# Patient Record
Sex: Female | Born: 1970 | Race: White | Hispanic: Yes | Smoking: Current every day smoker
Health system: Southern US, Community
[De-identification: ages and names within clinical notes are randomized; demographics above are authoritative.]

## PROBLEM LIST (undated history)

## (undated) DIAGNOSIS — F329 Major depressive disorder, single episode, unspecified: Secondary | ICD-10-CM

## (undated) DIAGNOSIS — M1990 Unspecified osteoarthritis, unspecified site: Secondary | ICD-10-CM

## (undated) DIAGNOSIS — F32A Depression, unspecified: Secondary | ICD-10-CM

## (undated) DIAGNOSIS — J302 Other seasonal allergic rhinitis: Secondary | ICD-10-CM

## (undated) DIAGNOSIS — K279 Peptic ulcer, site unspecified, unspecified as acute or chronic, without hemorrhage or perforation: Secondary | ICD-10-CM

## (undated) HISTORY — PX: CHOLECYSTECTOMY: SHX55

## (undated) HISTORY — DX: Unspecified osteoarthritis, unspecified site: M19.90

## (undated) HISTORY — DX: Depression, unspecified: F32.A

## (undated) HISTORY — DX: Major depressive disorder, single episode, unspecified: F32.9

## (undated) HISTORY — DX: Other seasonal allergic rhinitis: J30.2

## (undated) HISTORY — DX: Peptic ulcer, site unspecified, unspecified as acute or chronic, without hemorrhage or perforation: K27.9

---

## 1989-10-18 HISTORY — PX: TUBAL LIGATION: SHX77

## 1999-01-30 ENCOUNTER — Encounter: Payer: Self-pay | Admitting: Endocrinology

## 1999-01-30 ENCOUNTER — Emergency Department (HOSPITAL_COMMUNITY): Admission: EM | Admit: 1999-01-30 | Discharge: 1999-01-30 | Payer: Self-pay

## 2003-01-09 ENCOUNTER — Emergency Department (HOSPITAL_COMMUNITY): Admission: EM | Admit: 2003-01-09 | Discharge: 2003-01-09 | Payer: Self-pay | Admitting: Emergency Medicine

## 2003-08-23 ENCOUNTER — Ambulatory Visit (HOSPITAL_COMMUNITY): Admission: RE | Admit: 2003-08-23 | Discharge: 2003-08-23 | Payer: Self-pay | Admitting: Family Medicine

## 2005-03-26 ENCOUNTER — Ambulatory Visit: Payer: Self-pay | Admitting: *Deleted

## 2005-03-26 ENCOUNTER — Ambulatory Visit: Payer: Self-pay | Admitting: Family Medicine

## 2005-04-06 ENCOUNTER — Ambulatory Visit (HOSPITAL_COMMUNITY): Admission: RE | Admit: 2005-04-06 | Discharge: 2005-04-06 | Payer: Self-pay | Admitting: Family Medicine

## 2005-06-07 ENCOUNTER — Ambulatory Visit: Payer: Self-pay | Admitting: Family Medicine

## 2005-06-08 ENCOUNTER — Ambulatory Visit (HOSPITAL_COMMUNITY): Admission: RE | Admit: 2005-06-08 | Discharge: 2005-06-08 | Payer: Self-pay | Admitting: Family Medicine

## 2006-06-14 ENCOUNTER — Emergency Department (HOSPITAL_COMMUNITY): Admission: EM | Admit: 2006-06-14 | Discharge: 2006-06-14 | Payer: Self-pay | Admitting: Emergency Medicine

## 2006-06-16 ENCOUNTER — Emergency Department (HOSPITAL_COMMUNITY): Admission: EM | Admit: 2006-06-16 | Discharge: 2006-06-16 | Payer: Self-pay | Admitting: Emergency Medicine

## 2006-06-21 ENCOUNTER — Emergency Department (HOSPITAL_COMMUNITY): Admission: EM | Admit: 2006-06-21 | Discharge: 2006-06-21 | Payer: Self-pay | Admitting: Emergency Medicine

## 2006-07-05 ENCOUNTER — Ambulatory Visit: Payer: Self-pay | Admitting: Family Medicine

## 2006-07-05 ENCOUNTER — Encounter (INDEPENDENT_AMBULATORY_CARE_PROVIDER_SITE_OTHER): Payer: Self-pay | Admitting: Specialist

## 2006-07-19 ENCOUNTER — Ambulatory Visit: Payer: Self-pay | Admitting: Family Medicine

## 2006-08-09 ENCOUNTER — Ambulatory Visit: Payer: Self-pay | Admitting: Family Medicine

## 2006-10-04 ENCOUNTER — Ambulatory Visit: Payer: Self-pay | Admitting: Family Medicine

## 2006-10-04 ENCOUNTER — Encounter (INDEPENDENT_AMBULATORY_CARE_PROVIDER_SITE_OTHER): Payer: Self-pay | Admitting: *Deleted

## 2007-04-20 ENCOUNTER — Ambulatory Visit: Payer: Self-pay | Admitting: Family Medicine

## 2007-04-20 ENCOUNTER — Encounter (INDEPENDENT_AMBULATORY_CARE_PROVIDER_SITE_OTHER): Payer: Self-pay | Admitting: Family Medicine

## 2007-05-23 ENCOUNTER — Ambulatory Visit: Payer: Self-pay | Admitting: Internal Medicine

## 2007-05-23 LAB — CONVERTED CEMR LAB
ALT: 10 units/L (ref 0–35)
AST: 12 units/L (ref 0–37)
CO2: 23 meq/L (ref 19–32)
Chloride: 108 meq/L (ref 96–112)
Creatinine, Ser: 0.64 mg/dL (ref 0.40–1.20)
Monocytes Absolute: 0.4 10*3/uL (ref 0.2–0.7)
Monocytes Relative: 8 % (ref 3–11)
Platelets: 316 10*3/uL (ref 150–400)
Potassium: 4.3 meq/L (ref 3.5–5.3)
RBC: 4.48 M/uL (ref 3.87–5.11)
RDW: 12.9 % (ref 11.5–14.0)
Total Bilirubin: 0.2 mg/dL — ABNORMAL LOW (ref 0.3–1.2)
Total Protein: 6.8 g/dL (ref 6.0–8.3)
Triglycerides: 70 mg/dL (ref <150)
WBC: 5.1 10*3/uL (ref 4.0–10.5)

## 2007-07-05 ENCOUNTER — Encounter (INDEPENDENT_AMBULATORY_CARE_PROVIDER_SITE_OTHER): Payer: Self-pay | Admitting: *Deleted

## 2007-12-12 ENCOUNTER — Ambulatory Visit: Payer: Self-pay | Admitting: Family Medicine

## 2008-01-09 ENCOUNTER — Encounter (INDEPENDENT_AMBULATORY_CARE_PROVIDER_SITE_OTHER): Payer: Self-pay | Admitting: General Surgery

## 2008-01-09 ENCOUNTER — Ambulatory Visit (HOSPITAL_COMMUNITY): Admission: EM | Admit: 2008-01-09 | Discharge: 2008-01-10 | Payer: Self-pay | Admitting: Emergency Medicine

## 2008-03-08 ENCOUNTER — Encounter (INDEPENDENT_AMBULATORY_CARE_PROVIDER_SITE_OTHER): Payer: Self-pay | Admitting: Family Medicine

## 2008-03-08 ENCOUNTER — Ambulatory Visit: Payer: Self-pay | Admitting: Family Medicine

## 2008-03-08 LAB — CONVERTED CEMR LAB
Chlamydia, DNA Probe: NEGATIVE
GC Probe Amp, Genital: NEGATIVE

## 2008-06-04 ENCOUNTER — Ambulatory Visit: Payer: Self-pay | Admitting: Internal Medicine

## 2008-06-04 ENCOUNTER — Ambulatory Visit (HOSPITAL_COMMUNITY): Admission: RE | Admit: 2008-06-04 | Discharge: 2008-06-04 | Payer: Self-pay | Admitting: Family Medicine

## 2009-01-09 ENCOUNTER — Ambulatory Visit: Payer: Self-pay | Admitting: Family Medicine

## 2009-01-09 LAB — CONVERTED CEMR LAB
ALT: 14 units/L (ref 0–35)
AST: 17 units/L (ref 0–37)
Alkaline Phosphatase: 42 units/L (ref 39–117)
BUN: 8 mg/dL (ref 6–23)
CO2: 20 meq/L (ref 19–32)
Free T4: 0.9 ng/dL (ref 0.89–1.80)
Sed Rate: 5 mm/hr (ref 0–22)
TSH: 1.771 microintl units/mL (ref 0.350–4.500)
Total Bilirubin: 0.3 mg/dL (ref 0.3–1.2)

## 2009-01-15 ENCOUNTER — Encounter: Admission: RE | Admit: 2009-01-15 | Discharge: 2009-01-15 | Payer: Self-pay | Admitting: Family Medicine

## 2009-09-16 ENCOUNTER — Ambulatory Visit: Payer: Self-pay | Admitting: Family Medicine

## 2009-09-16 LAB — CONVERTED CEMR LAB
Eosinophils Relative: 5 % (ref 0–5)
HDL: 47 mg/dL (ref >39)
Hemoglobin: 13.9 g/dL (ref 12.0–15.0)
LDL Cholesterol: 122 mg/dL — ABNORMAL HIGH (ref 0–99)
Lymphocytes Relative: 31 % (ref 12–46)
Lymphs Abs: 2.1 10*3/uL (ref 0.7–4.0)
MCHC: 35 g/dL (ref 30.0–36.0)
MCV: 89.8 fL (ref 78.0–100.0)
Monocytes Absolute: 0.8 10*3/uL (ref 0.1–1.0)
Monocytes Relative: 12 % (ref 3–12)
Neutro Abs: 3.4 10*3/uL (ref 1.7–7.7)
RDW: 12.6 % (ref 11.5–15.5)
Total CHOL/HDL Ratio: 5.2
VLDL: 76 mg/dL — ABNORMAL HIGH (ref 0–40)
WBC: 6.7 10*3/uL (ref 4.0–10.5)

## 2009-12-05 ENCOUNTER — Ambulatory Visit: Payer: Self-pay | Admitting: Family Medicine

## 2009-12-12 ENCOUNTER — Ambulatory Visit: Payer: Self-pay | Admitting: Internal Medicine

## 2009-12-19 ENCOUNTER — Ambulatory Visit: Payer: Self-pay | Admitting: Internal Medicine

## 2010-01-01 ENCOUNTER — Ambulatory Visit: Payer: Self-pay | Admitting: Family Medicine

## 2010-01-01 LAB — CONVERTED CEMR LAB
Cholesterol: 232 mg/dL — ABNORMAL HIGH (ref 0–200)
Total CHOL/HDL Ratio: 5.8
Triglycerides: 151 mg/dL — ABNORMAL HIGH (ref <150)
VLDL: 30 mg/dL (ref 0–40)

## 2010-01-15 ENCOUNTER — Ambulatory Visit: Payer: Self-pay | Admitting: Family Medicine

## 2010-01-23 ENCOUNTER — Ambulatory Visit: Payer: Self-pay | Admitting: Internal Medicine

## 2010-02-27 ENCOUNTER — Ambulatory Visit: Payer: Self-pay | Admitting: Internal Medicine

## 2010-03-10 ENCOUNTER — Ambulatory Visit: Payer: Self-pay | Admitting: Family Medicine

## 2010-03-10 LAB — CONVERTED CEMR LAB
Calcium: 9.3 mg/dL (ref 8.4–10.5)
Creatinine, Ser: 0.64 mg/dL (ref 0.40–1.20)
Glucose, Bld: 92 mg/dL (ref 70–99)
Total CHOL/HDL Ratio: 4.9
VLDL: 27 mg/dL (ref 0–40)
Vit D, 25-Hydroxy: 32 ng/mL (ref 30–89)

## 2010-03-25 ENCOUNTER — Ambulatory Visit: Payer: Self-pay | Admitting: Family Medicine

## 2010-03-26 ENCOUNTER — Encounter (INDEPENDENT_AMBULATORY_CARE_PROVIDER_SITE_OTHER): Payer: Self-pay | Admitting: Family Medicine

## 2010-03-26 LAB — CONVERTED CEMR LAB
Chlamydia, DNA Probe: NEGATIVE
GC Probe Amp, Genital: NEGATIVE

## 2010-03-27 ENCOUNTER — Ambulatory Visit: Payer: Self-pay | Admitting: Internal Medicine

## 2010-04-03 ENCOUNTER — Ambulatory Visit (HOSPITAL_COMMUNITY): Admission: RE | Admit: 2010-04-03 | Discharge: 2010-04-03 | Payer: Self-pay | Admitting: Family Medicine

## 2010-04-15 ENCOUNTER — Encounter: Admission: RE | Admit: 2010-04-15 | Discharge: 2010-04-15 | Payer: Self-pay | Admitting: Family Medicine

## 2010-04-24 ENCOUNTER — Ambulatory Visit: Payer: Self-pay | Admitting: Family Medicine

## 2010-05-15 ENCOUNTER — Ambulatory Visit: Payer: Self-pay | Admitting: Internal Medicine

## 2010-05-28 ENCOUNTER — Ambulatory Visit: Payer: Self-pay | Admitting: Family Medicine

## 2010-08-06 ENCOUNTER — Encounter (INDEPENDENT_AMBULATORY_CARE_PROVIDER_SITE_OTHER): Payer: Self-pay | Admitting: Family Medicine

## 2010-08-06 LAB — CONVERTED CEMR LAB
Bilirubin, Direct: 0.1 mg/dL (ref 0.0–0.3)
Indirect Bilirubin: 0.3 mg/dL (ref 0.0–0.9)
Total Protein: 7.4 g/dL (ref 6.0–8.3)

## 2010-10-27 ENCOUNTER — Encounter
Admission: RE | Admit: 2010-10-27 | Discharge: 2010-10-27 | Payer: Self-pay | Source: Home / Self Care | Attending: Family Medicine | Admitting: Family Medicine

## 2010-11-03 ENCOUNTER — Encounter (INDEPENDENT_AMBULATORY_CARE_PROVIDER_SITE_OTHER): Payer: Self-pay | Admitting: Family Medicine

## 2010-11-03 LAB — CONVERTED CEMR LAB
Eosinophils Relative: 4 % (ref 0–5)
HCT: 37.4 % (ref 36.0–46.0)
HDL: 36 mg/dL — ABNORMAL LOW (ref >39)
Lymphocytes Relative: 35 % (ref 12–46)
Lymphs Abs: 2.2 10*3/uL (ref 0.7–4.0)
MCHC: 33.2 g/dL (ref 30.0–36.0)
MCV: 91 fL (ref 78.0–100.0)
Monocytes Relative: 9 % (ref 3–12)
Neutro Abs: 3.4 10*3/uL (ref 1.7–7.7)
Neutrophils Relative %: 52 % (ref 43–77)
Platelets: 286 10*3/uL (ref 150–400)
RBC: 4.11 M/uL (ref 3.87–5.11)
Total CHOL/HDL Ratio: 5.6
Triglycerides: 246 mg/dL — ABNORMAL HIGH (ref <150)
WBC: 6.4 10*3/uL (ref 4.0–10.5)

## 2010-11-08 ENCOUNTER — Encounter: Payer: Self-pay | Admitting: Family Medicine

## 2011-03-02 NOTE — Op Note (Signed)
Rita Sanchez        ACCOUNT NO.:  [id number]   MEDICAL RECORD NO.:  [id number]          PATIENT TYPE:  INP   LOCATION:  1607                         FACILITY:  [facility]   PHYSICIAN:  Sharlet Salina T. Sanchez, M.D.DATE OF BIRTH:  Mar 05, 1971   DATE OF PROCEDURE:  01/09/2008  DATE OF DISCHARGE:                               OPERATIVE REPORT   POSTOPERATIVE DIAGNOSIS:  Cholelithiasis and cholecystitis.   SURGICAL PROCEDURE:  Laparoscopic cholecystectomy with intraoperative  cholangiogram.   SURGEON:  Sharlet Salina T. Sanchez, M.D.   ANESTHESIA:  General.   BRIEF HISTORY:  Ms. Rita Sanchez is a 40 year old Hispanic female  with a 42-month history of intermittent worsening epigastric and right  upper quadrant pain.  She now presents to the emergency room with more  persistent severe pain, nausea and vomiting over the last 24 hours.  Workup has included a gallbladder ultrasound showing gallbladder filled  with stones, gallbladder wall thickening and pericholecystic fluid  consistent with acute cholecystitis.  LFTs are all within normal limits.  I have recommend proceeding with laparoscopic cholecystectomy with  cholangiogram.  Via interpreter I discussed the indications for the  procedure, its nature, expected recovery, risks of bleeding, infection,  bile leak, bile duct injury, possible need for open procedure.  She is  now brought to operating room for this procedure.   DESCRIPTION OF OPERATION:  The patient was brought to the operating room  and placed in supine position on the operating table and general  orotracheal anesthesia was induced.  The abdomen was widely sterilely  prepped and draped.  She was given IV antibiotics preoperatively.  Correct patient and procedure were verified.  1 cm incision was used at  the umbilicus, dissection carried down to midline fascia which was  sharply incised for 1 cm.  The peritoneum entered under direct vision.  Through a mattress suture  of 0 Vicryl the Hasson trocar was placed and  pneumoperitoneum established.  Under direct vision an 11 mm trocar was  placed in the epigastric area and two 5 mm trocars on the right  subcostal margin.  The gallbladder was visualized and was thickened and  edematous.  The fundus was grasped and elevated over the liver.  Some  chronic omental adhesions were taken down off the fundus of the  gallbladder.  The infundibulum was exposed and retracted  inferolaterally.  Careful dissection was carried down along the  infundibulum.  As we got to the distal gallbladder there was a structure  that was really quite contiguous to the gallbladder but there was a  dissection plane and as I developed this, it became clear that this  structure was the common bile duct which was adherent to the  infundibulum of the gallbladder.  However, the plane dissected  relatively easily and I was able to completely free the infundibulum  from the common duct which was moderately dilated.  The distal  gallbladder and Calot's triangle was then thoroughly dissected.  The  gallbladder was seen to taper down to a cystic duct that was quite short  and fairly large diameter for a cystic duct.  However, I completely  skeletonized Calot's triangle.  A small cystic artery was seen coursing  up on the gallbladder wall and critical view was obtained with no other  structures going up to the gallbladder.  However, posteriorly as I freed  the gallbladder, you could see that the cystic duct again was quite  short and entered basically right at the bifurcation of the right and  left hepatic ducts with the right hepatic duct seen going up directly  posterior to the infundibulum of the gallbladder.  However, we did get  about a centimeter of completely free cystic duct dissected out and I  obtained an operative cholangiogram through an opening in the cystic  duct at the gallbladder junction.  This confirmed the anatomy that I was   seeing with a mildly to moderately dilated intrahepatic ducts and common  bile duct but there was good opacification of all ducts quite prompt  free flow into the duodenum and no filling defects.  At this point, the  cholangiocath was removed.  The cystic duct was just a little too wide  to be adequately controlled with a clip.  I therefore divided the cystic  duct flush with the gallbladder which again left about a centimeter  before joining the confluence of the left and right hepatic ducts.  The  cystic duct was then elevated and under direct vision, securely  controlled with a PDS Endoloop.  After this I then was able to place a  clip just distal to the Endoloop as well.  There was clearly no  impingement on the common duct or hepatic ducts.  Following this, the  small cystic artery was divided between clips.  The gallbladder was then  carefully dissected free well away from the ducts and then away from the  liver bed with a lot of chronic inflammation.  The gallbladder was  placed in an EndoCatch bag and brought out through the umbilicus after  extracting multiple stones.  Following this, the right upper quadrant  was thoroughly irrigated.  Hemostasis obtained in the gallbladder bed  using cautery.  A Surgicel pack was additionally placed.  There was  certainly no evidence of any bile duct injury or management.  Trocars  removed under direct vision, all CO2 evacuated.  The mattress sutures  were secured at the umbilicus.  Skin incisions were closed with  interrupted subcuticular Monocryl and Dermabond.  Sponge and needle  counts were correct.  The patient taken to recovery in good condition.      Rita Sanchez, M.D.  Electronically Signed     BTH/MEDQ  D:  01/09/2008  T:  01/10/2008  Job:  [id number]

## 2011-03-02 NOTE — H&P (Signed)
NAMEALYCEA, SEGOVIANO        ACCOUNT NO.:  [id number]   MEDICAL RECORD NO.:  [id number]          PATIENT TYPE:  EMS   LOCATION:  ED                           FACILITY:  [facility]   PHYSICIAN:  Lorne Skeens. Hoxworth, M.D.DATE OF BIRTH:  November 10, 1970   DATE OF ADMISSION:  01/09/2008  DATE OF DISCHARGE:                              HISTORY & PHYSICAL   CHIEF COMPLAINT:  Abdominal pain.   HISTORY OF PRESENT ILLNESS:  Ms. Rita Sanchez is a 40 year old Hispanic  female who presents to [facility] Emergency Room today with acute  abdominal pain.  Her history is obtained through an interpreter.  She  has been apparently having intermittent epigastric and right upper  quadrant abdominal pain for about 2 months.  This has been treated with  acid suppression and TUMS with slight relief.  Last night, however, she  developed more severe constant pain in her epigastrium and right upper  quadrant.  This was associated with nausea and vomiting.  The pain  persisted.  She presents to the [facility] Emergency Room.  No apparent  fever or chills.  No jaundice noted.   PAST MEDICAL HISTORY:  Medically, she is treated for depression.  Her  only surgery is a C-section.  She gets her medical care at [facility].   MEDICATIONS:  Antidepressant, unknown type which she just started.   ALLERGIES:  NO KNOWN DRUG ALLERGIES.   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  She does smoke cigarettes, does not drink alcohol.   FAMILY HISTORY:  Positive for diabetes in her mother.   REVIEW OF SYSTEMS:  GENERAL:  No fever, chills, weight change.  RESPIRATORY: Denies shortness of breath, cough, asthma, history of lung  disease.  CARDIAC:  Denies chest pain, palpitations, history of heart  disease.  ABDOMEN/GI:  Negative except as above.   PHYSICAL EXAMINATION:  VITAL SIGNS:  Temperature is 98.5, pulse 91,  respirations 19, blood pressure 150/69.  GENERAL:  She is a mildly  overweight, alert Hispanic female in no acute  stress.  SKIN:  Warm and dry.  No rash or infection.  HEENT: No palpable masses or thyromegaly.  Sclera nonicteric.  Oropharynx clear.  No cervical, subclavicular, inguinal nodes palpable.  LUNGS:  Clear without wheezing or increased work of breathing.  CARDIAC:  Regular rate and rhythm.  No murmurs.  No edema.  Peripheral  pulses intact.  ABDOMEN:  There is tenderness in the epigastrium, right  upper quadrant and somewhat in the right lower quadrant with some  guarding.  No discernible masses or hepatosplenomegaly.  EXTREMITIES: No joint swelling deformity, edema.  NEUROLOGIC:  Alert, oriented, conversive.  Motor and sensory exams  grossly normal.   LABORATORY/X-RAY DATA:  Electrolytes, LFTs, lipase all within normal  limits.  CBC unremarkable.  White count of 6.8, hemoglobin 13.5.  Urinalysis negative.  Urine pregnancy negative.   Ultrasound of the abdomen is reviewed.  This shows multiple gallstones  within the gallbladder, gallbladder wall thickening and pericholecystic  fluid consistent with acute cholecystitis.   IMPRESSION/PLAN:  Acute abdominal pain, consistent with acute  cholecystitis and gallbladder  ultrasound showing multiple stones,  gallbladder wall thickening and pericholecystic fluid.  I am  recommending admission with IV antibiotics and emergency laparoscopic  cholecystectomy with cholangiogram.  The nature of the procedure was  discussed with the patient via interpreter, including risks of bleeding,  infection, bile leak, bile duct injury.  She is admitted for this  procedure.      Lorne Skeens. Hoxworth, M.D.  Electronically Signed     BTH/MEDQ  D:  01/09/2008  T:  01/09/2008  Job:  [id number]

## 2011-07-12 LAB — COMPREHENSIVE METABOLIC PANEL
AST: 17
Albumin: 3 — ABNORMAL LOW
Alkaline Phosphatase: 37 — ABNORMAL LOW
BUN: 1 — ABNORMAL LOW
BUN: 5 — ABNORMAL LOW
Calcium: 8.5
Calcium: 9
Chloride: 104
Creatinine, Ser: 0.54
Creatinine, Ser: 0.66
GFR calc Af Amer: >60
Glucose, Bld: 105 — ABNORMAL HIGH
Glucose, Bld: 98
Potassium: 3.7
Sodium: 135
Total Protein: 5.5 — ABNORMAL LOW
Total Protein: 6.5

## 2011-07-12 LAB — DIFFERENTIAL
Eosinophils Relative: 6 — ABNORMAL HIGH
Lymphs Abs: 1.4
Monocytes Relative: 9

## 2011-07-12 LAB — CBC
HCT: 33.9 — ABNORMAL LOW
Hemoglobin: 11.7 — ABNORMAL LOW
MCHC: 33
MCHC: 34.5
MCV: 90.7
MCV: 91
Platelets: 279
RDW: 12.1

## 2011-07-12 LAB — URINALYSIS, ROUTINE W REFLEX MICROSCOPIC
Bilirubin Urine: NEGATIVE
Hgb urine dipstick: NEGATIVE
Nitrite: NEGATIVE
Protein, ur: NEGATIVE
Specific Gravity, Urine: 1.012
Urobilinogen, UA: 0.2
pH: 6

## 2011-07-12 LAB — LIPASE, BLOOD: Lipase: 80 — ABNORMAL HIGH

## 2011-08-19 DIAGNOSIS — E785 Hyperlipidemia, unspecified: Secondary | ICD-10-CM | POA: Insufficient documentation

## 2011-08-19 DIAGNOSIS — G43909 Migraine, unspecified, not intractable, without status migrainosus: Secondary | ICD-10-CM | POA: Insufficient documentation

## 2011-08-19 DIAGNOSIS — Z8673 Personal history of transient ischemic attack (TIA), and cerebral infarction without residual deficits: Secondary | ICD-10-CM | POA: Insufficient documentation

## 2012-07-21 ENCOUNTER — Ambulatory Visit (INDEPENDENT_AMBULATORY_CARE_PROVIDER_SITE_OTHER): Payer: Self-pay | Admitting: Family Medicine

## 2012-07-21 VITALS — BP 136/87 | HR 77 | Temp 98.3°F | Ht 59.0 in | Wt 166.0 lb

## 2012-07-21 DIAGNOSIS — Z23 Encounter for immunization: Secondary | ICD-10-CM

## 2012-07-21 DIAGNOSIS — Z0000 Encounter for general adult medical examination without abnormal findings: Secondary | ICD-10-CM

## 2012-07-21 DIAGNOSIS — F17200 Nicotine dependence, unspecified, uncomplicated: Secondary | ICD-10-CM

## 2012-07-21 DIAGNOSIS — F32A Depression, unspecified: Secondary | ICD-10-CM

## 2012-07-21 DIAGNOSIS — F329 Major depressive disorder, single episode, unspecified: Secondary | ICD-10-CM | POA: Insufficient documentation

## 2012-07-21 DIAGNOSIS — K279 Peptic ulcer, site unspecified, unspecified as acute or chronic, without hemorrhage or perforation: Secondary | ICD-10-CM

## 2012-07-21 DIAGNOSIS — Z720 Tobacco use: Secondary | ICD-10-CM

## 2012-07-21 MED ORDER — OMEPRAZOLE 20 MG PO CPDR: 20.0000 mg | DELAYED_RELEASE_CAPSULE | Freq: Two times a day (BID) | ORAL | Status: DC

## 2012-07-21 MED ORDER — FLUOXETINE HCL 20 MG PO CAPS: 20.0000 mg | ORAL_CAPSULE | Freq: Every day | ORAL | Status: DC

## 2012-07-21 NOTE — Patient Instructions (Addendum)
It was great to meet you today!  I have sent your ulcer medicine (omeprazole) and a new depression medicine (prozac) to your pharmacy ([facility] on [address]).  If you have any problems with the medicine please let me know. If you begin to have thoughts of hurting yourself or anyone else, please call our office or go to the emergency room to stay safe.  I will see you back in 2 weeks to see how you are doing and to talk about quitting smoking.

## 2012-07-23 ENCOUNTER — Encounter: Payer: Self-pay | Admitting: Family Medicine

## 2012-07-23 DIAGNOSIS — Z716 Tobacco abuse counseling: Secondary | ICD-10-CM | POA: Insufficient documentation

## 2012-07-23 DIAGNOSIS — Z0000 Encounter for general adult medical examination without abnormal findings: Secondary | ICD-10-CM | POA: Insufficient documentation

## 2012-07-23 NOTE — Assessment & Plan Note (Signed)
Will refill omeprazole per pt request.

## 2012-07-23 NOTE — Assessment & Plan Note (Signed)
Currently smokes 2-3 cigarettes per day and desires to quit. Will see pt back in 2 weeks to check on mood and discuss smoking cessation tactics.

## 2012-07-23 NOTE — Assessment & Plan Note (Addendum)
[  x] Pap smear last in June 2011 - will repeat again next year [ ]  Mammogram was normal less than one year ago per pt, but not visible in chart (which shows need for screening mammogram in June 2012). Will readdress with pt when she follows up in 2 weeks. [x]  Flu shot given today [x]  Tetanus shot given today.

## 2012-07-23 NOTE — Progress Notes (Addendum)
Patient ID: Rita Sanchez, female   DOB: 09-May-1971, 41 y.o.   MRN: [id number]  HPI: Rita Sanchez is a 40 y.o. female here to establish care. She was previously a patient of [facility], which recently closed. She has the [employer]. Spanish speaking only, interpreter used at today's visit.  Peptic Ulcer Disease: Pt takes omeprazole 20mg  BID. She states she was diagnosed with a peptic ulcer and was told to take this medication. Denies any blood in her stool. No problems with the medication. Requesting a refill.   Depression: Pt states she had a period of depression about two years ago and took a medication at that time, but she is unable to recall the name of the medicine. She thinks she has been depressed again recently. She is sleeping a lot, doesn't want to leave the house, cries a lot, increased appetite. Has also had generalized body pains/fatigue. Also has some anxiety/feeling jittery. She lives with her daughters and grandchildren. Denies current SI/HI, but did have suicidal ideation two years ago, with an actual plan to cut her wrists with a knife. She did not ever get the knife, and instead cleaned her house. Prior to the period two years ago, had never had mood problems and never took any other medications for her mood. She thinks she would benefit from a medication for her depression now.  ROS: questionnaire positive for all of the following: urinary incontinence, nausea, constipation, vision troubles, ringing in the ears, swelling of the hands or feet, muscle cramps or pain, joint pain or swelling, headache, weakness, dizziness, sadness, anxiety, and stress.  PMH: Seasonal allergies Arthritis Peptic ulcer Depression  Allergies: Pt did not list any drug allergies on new patient PMH form.  Health Maintenance: Normal mammogram less than one year ago Last pap in 2011 - normal  Surgeries: 3 c-sections in 1986, 1988, and 1991 Tubal ligation in 1991  Family  History: Father with heart disease and diabetes Mother with diabetes  Social History: Highest level of education middle school Works full time at [employer], owns a car Spanish speaking only Does not exercise regularly Smokes 2-3 cigarettes per day and wants to quit Drinks alcohol monthly or less, has 6 or more drinks at one time less than once a month Feels secure in relationships.   Exam: BP 136/87  Pulse 77  Temp 98.3 F (36.8 C) (Oral)  Ht 4\' 11"  (1.499 m)  Wt 166 lb (75.297 kg)  BMI 33.53 kg/m2  Gen: NAD Heart: RRR Lungs: CTAB Abd: soft, no masses, mildly tender in bilateral lower quadrants Psych: full range of affect, no SI/HI, no evidence of internal stimuli Neuro: nonfocal, speech intact

## 2012-07-23 NOTE — Assessment & Plan Note (Signed)
PHQ-9 done today, score is 24. No current SI/HI. Will rx fluoxetine 20mg  daily and have pt f/u in 2 weeks to reassess mood.

## 2012-08-14 ENCOUNTER — Encounter: Payer: Self-pay | Admitting: Family Medicine

## 2012-08-14 ENCOUNTER — Ambulatory Visit (INDEPENDENT_AMBULATORY_CARE_PROVIDER_SITE_OTHER): Payer: Self-pay | Admitting: Family Medicine

## 2012-08-14 VITALS — BP 112/73 | HR 61 | Ht 59.0 in | Wt 160.6 lb

## 2012-08-14 DIAGNOSIS — K279 Peptic ulcer, site unspecified, unspecified as acute or chronic, without hemorrhage or perforation: Secondary | ICD-10-CM

## 2012-08-14 DIAGNOSIS — F17200 Nicotine dependence, unspecified, uncomplicated: Secondary | ICD-10-CM

## 2012-08-14 DIAGNOSIS — F32A Depression, unspecified: Secondary | ICD-10-CM

## 2012-08-14 DIAGNOSIS — Z0000 Encounter for general adult medical examination without abnormal findings: Secondary | ICD-10-CM

## 2012-08-14 DIAGNOSIS — F329 Major depressive disorder, single episode, unspecified: Secondary | ICD-10-CM

## 2012-08-14 DIAGNOSIS — Z720 Tobacco use: Secondary | ICD-10-CM

## 2012-08-14 DIAGNOSIS — F3289 Other specified depressive episodes: Secondary | ICD-10-CM

## 2012-08-14 NOTE — Patient Instructions (Addendum)
It was great to see you again today! I am glad you are feeling better.  Do not take any NSAIDs (non-steroidal anti inflammatory drugs) like ibuprofen or naproxen. Instead you should try tylenol for your hand pain.  Call the number on the handout to schedule your mammogram.  I'll see you back in around one month to see how quitting smoking is going and see how you're doing.

## 2012-08-15 MED ORDER — OMEPRAZOLE 20 MG PO CPDR: 20.0000 mg | DELAYED_RELEASE_CAPSULE | Freq: Two times a day (BID) | ORAL | Status: DC

## 2012-08-15 NOTE — Assessment & Plan Note (Addendum)
PHQ-9 done, score is 21 (improved from 24 on 10/6). Seems to have some improvement since starting prozac on 10/6, although this is likely somewhat due to placebo effect. Tolerating well. Denies SI/HI. Will continue current dose. F/u in 1 month to reassess mood.

## 2012-08-15 NOTE — Assessment & Plan Note (Signed)
Refill omeprazole

## 2012-08-15 NOTE — Assessment & Plan Note (Signed)
Counseled on smoking cessation today. Will f/u her progress in 1 month when she returns to reassess mood. At that time, she will hopefully be down to one cigarette per day.

## 2012-08-15 NOTE — Assessment & Plan Note (Signed)
[  x] Pt given handout on mammogram scheduling; she is due. [x]  pap not due until June 2013 [  ] need to obtain fasting lipids and screen for diabetes - will defer to future visit

## 2012-08-15 NOTE — Progress Notes (Addendum)
Patient ID: COURTNI BALASH, female   DOB: 12/13/1970, 41 y.o.   MRN: [id number]  HPI: JOANANN MIES is a 41 y.o. female here to follow up on her depression and to discuss arthritis in her hands.  Depression: last time we started her on fluoxetine 20mg  daily. She thinks she has improved since starting it. She has been somewhat fidgety but says it's getting better. Has not had any tearfulness. Denies SI/HI. Thinks she sleeps too much. Her appetite has been okay. She seems to be tolerating the medication well.  Arthritis: sometimes it feels like her legs and hands are asleep. Says she has arthritis in her hands.   Smoking: she smokes 3-4 cigarettes per day. She is very motivated to quit. Counseled on smoking cessation techniques.  Peptic ulcer: requesting refill on omeprazole. Takes 20mg  BID. Says she can tell if she doesn't take it because her stomach hurts.  ROS: See HPI  PHYSICAL EXAM: BP 112/73  Pulse 61  Ht 4\' 11"  (1.499 m)  Wt 160 lb 9.6 oz (72.848 kg)  BMI 32.44 kg/m2  LMP 08/07/2012 Gen: NAD Heart: RRR, no m/r/g Lungs: CTAB Neuro: nonfocal, speech intact Psych: normal affect, tearful at one point when discussing sad situation with interpreter, no SI/HI. Ext: hands appear normal without swelling, redness, or warmth

## 2012-09-11 ENCOUNTER — Ambulatory Visit: Payer: Self-pay | Admitting: Family Medicine

## 2012-09-19 ENCOUNTER — Encounter: Payer: Self-pay | Admitting: Home Health Services

## 2012-10-04 ENCOUNTER — Ambulatory Visit (INDEPENDENT_AMBULATORY_CARE_PROVIDER_SITE_OTHER): Payer: Self-pay | Admitting: Emergency Medicine

## 2012-10-04 ENCOUNTER — Encounter: Payer: Self-pay | Admitting: Emergency Medicine

## 2012-10-04 VITALS — BP 117/98 | HR 59 | Temp 99.1°F | Wt 165.0 lb

## 2012-10-04 DIAGNOSIS — M549 Dorsalgia, unspecified: Secondary | ICD-10-CM

## 2012-10-04 NOTE — Patient Instructions (Addendum)
There is a muscle in your butt that is pinching a nerve. Please do the exercises on the sheet twice a day. We gave you a shot of Toradol in clinic today. Starting TOMORROW, take Advil 3 tablets every 8 hours for the next 5 days. If the pain, tingling, or weakness are getting worse, please come back. If the pain is not improving by next week, please come back.

## 2012-10-04 NOTE — Progress Notes (Signed)
  Subjective:    Patient ID: Rita Sanchez, female    DOB: Mar 02, 1971, 41 y.o.   MRN: [id number]  HPI Rita Sanchez is here for a SDA for back pain.  Daughter acted as Equities trader at their request.  She reports that the back pain started suddenly Saturday night.  Denies any triggering events.  It is located in the right lower back.  Her right leg will sometimes get tingly with movement.  States the right leg feels weak.  Described as a "hot" pain.  No bowel or bladder incontinence.  The same thing has happened before.  The first time it resolved on it's own.  The last time ws 6 years ago and some shot made it better in the ED.  She has been taking intermittent tylenol and ibuprofen since this started with no relief.   SHx: current smoker  Review of Systems See HPI    Objective:   Physical Exam BP 117/98  Pulse 59  Temp 99.1 F (37.3 C) (Oral)  Wt 165 lb (74.844 kg) Gen: alert, cooperative, very uncomfortable with movement but able to get on table Back: no erythema or swelling, normal vertebral alignment; no spinal tenderness; tender over right SI joint and mid-buttock; strength 5-/5 in right leg compared to 5/5 in left; patellar reflex is 1+ and symmetric; sensation is grossly intact     Assessment & Plan:

## 2012-10-04 NOTE — Assessment & Plan Note (Signed)
Likely piriformis syndrome with some sciatic nerve compression.  No concern for spinal involvement given location of pain.  Neurologic exam essentially normal.  Will give toradol 60mg  IM now.  Gave hand out on exercises for piriformis syndrome.  Also recommended taking scheduled ibuprofen 600mg  TID x5 days.  Reviewed warning signs with patient and daughter.  Follow up if no improving in 1-2 weeks.

## 2012-10-12 ENCOUNTER — Ambulatory Visit (INDEPENDENT_AMBULATORY_CARE_PROVIDER_SITE_OTHER): Payer: Self-pay | Admitting: Family Medicine

## 2012-10-12 VITALS — BP 110/70 | HR 73 | Ht 59.0 in | Wt 168.0 lb

## 2012-10-12 DIAGNOSIS — M25559 Pain in unspecified hip: Secondary | ICD-10-CM

## 2012-10-12 MED ORDER — CYCLOBENZAPRINE HCL 10 MG PO TABS: 10.0000 mg | ORAL_TABLET | Freq: Three times a day (TID) | ORAL | Status: DC | PRN

## 2012-10-12 NOTE — Progress Notes (Signed)
  Subjective:    Patient ID: Rita Sanchez, female    DOB: 12/24/1970, 41 y.o.   MRN: [id number]  HPI SDA due to persistent right sided back pain. She feels like it is getting worse and now she has right hip pain.   She reports hurting it on 12/14. She may have pulled something when she got out of the shower.  On 12/18 she saw a provider and was diagnosed with piriformis syndrome. She received a Toradol shot that day; it provided relief for about 3-4 hours.   She is taking Advil a few times a day. It provides relief for 1-2 hours, enough so that she is able to do her normal activities.   Review of Systems Denies urine/stool incontinence, leg numbness, falls  Allergies, medication, past medical history reviewed.      Objective:   Physical Exam GEN: appears uncomfortable; overweight with significant abdominal girth; her daughter is translating in Spanish for her  PSYCH: appropriate to questions BACK:  Range of motion at  the waist: Flexion 60 deg, extension 30 deg, lateral bending intact. Pain more with flexion than extension.    Inspection:   No echymosis or edema  Rises to examination table with mild difficulty Gait: minimally antalgic  Inspection/Deformity: N Paraspinus Tenderness: none  B Ankle Dorsiflexion (L5,4): 5/5 B Great Toe Dorsiflexion (L5,4): 5/5  SENSATION: intact  RIGHT HIP:    Inspection: normal   ROM: limited external rotation due to pain (positive Pearlean Brownie); negative on left    SLR: uncomfortable on right with pain in hip but does not radiate; negative on left    Knees: normal, no effusion   Piriformis: mild-moderate tenderness with palpation   Hip rocking: normal SIJ movement   Strength: 4/5 abduction and adduction bilaterally    Significant tenderness to palpation over right trochanteric bursa; no erythema, warmth, rash over this area   Procedure: right trochanteric bursa glucocorticoid injection Informed written consent received  Area to be  injected marked and then prepped with alcohol swabs x 2 25 gauge needle inserted and 4 mL of 2% lidocaine without epinephrine and 40 mg of Depo-medrol injected Needle removed and area covered with band-aide Minimal blood loss Patient informed that pain may improve in next few hours but will be about the same prior to injection tomorrow morning. Expect improvement in 2 days and maximum affect in about a week. Patient expressed understanding.      Assessment & Plan:

## 2012-10-12 NOTE — Patient Instructions (Addendum)
Trate el medicamento (Flexeril) para relaja los musculos en la espalda.  Continue a tomar ibuprofen 600 mg cada 6 hours. Siempre tome con comida para prevenir nausea.   Haga los ejercicos/estrechos.   Regrese a Pension scheme manager con doctora McIntyre si todavia tiene dolor o mas pronto si el dolor es mas peor.   Una carta para el [place].

## 2012-10-12 NOTE — Assessment & Plan Note (Signed)
She seems to have strained her right back about 2 weeks ago, developed right piriformis syndrome, and now has right-sided trochanteric bursitis. She had some relief following glucocorticoid injection. She was advised to continue NSAIDs as needed and try warm/cool compresses. SIJ stretches were also demonstrated, and she was encouraged to do daily as tolerated. Follow-up in 2 weeks or sooner if needed.

## 2012-12-04 ENCOUNTER — Other Ambulatory Visit: Payer: Self-pay | Admitting: Family Medicine

## 2012-12-04 DIAGNOSIS — Z1231 Encounter for screening mammogram for malignant neoplasm of breast: Secondary | ICD-10-CM

## 2012-12-28 ENCOUNTER — Ambulatory Visit
Admission: RE | Admit: 2012-12-28 | Discharge: 2012-12-28 | Disposition: A | Discharge status: Home / Self Care | Payer: No Typology Code available for payment source | Source: Ambulatory Visit | Attending: Family Medicine | Admitting: Family Medicine

## 2012-12-28 DIAGNOSIS — Z1231 Encounter for screening mammogram for malignant neoplasm of breast: Secondary | ICD-10-CM

## 2013-01-12 ENCOUNTER — Ambulatory Visit (INDEPENDENT_AMBULATORY_CARE_PROVIDER_SITE_OTHER): Payer: No Typology Code available for payment source | Admitting: Family Medicine

## 2013-01-12 ENCOUNTER — Encounter: Payer: Self-pay | Admitting: Family Medicine

## 2013-01-12 VITALS — BP 107/70 | HR 74 | Temp 98.8°F | Ht 59.0 in | Wt 165.0 lb

## 2013-01-12 DIAGNOSIS — F17200 Nicotine dependence, unspecified, uncomplicated: Secondary | ICD-10-CM

## 2013-01-12 DIAGNOSIS — R51 Headache: Secondary | ICD-10-CM

## 2013-01-12 DIAGNOSIS — Z7189 Other specified counseling: Secondary | ICD-10-CM

## 2013-01-12 DIAGNOSIS — Z716 Tobacco abuse counseling: Secondary | ICD-10-CM

## 2013-01-12 DIAGNOSIS — K279 Peptic ulcer, site unspecified, unspecified as acute or chronic, without hemorrhage or perforation: Secondary | ICD-10-CM

## 2013-01-12 DIAGNOSIS — F329 Major depressive disorder, single episode, unspecified: Secondary | ICD-10-CM

## 2013-01-12 DIAGNOSIS — R011 Cardiac murmur, unspecified: Secondary | ICD-10-CM

## 2013-01-12 DIAGNOSIS — K219 Gastro-esophageal reflux disease without esophagitis: Secondary | ICD-10-CM

## 2013-01-12 LAB — CBC
HCT: 39.2 % (ref 36.0–46.0)
Hemoglobin: 14.1 g/dL (ref 12.0–15.0)
MCH: 30.9 pg (ref 26.0–34.0)
MCV: 85.8 fL (ref 78.0–100.0)
Platelets: 320 10*3/uL (ref 150–400)
RBC: 4.57 MIL/uL (ref 3.87–5.11)
WBC: 9 10*3/uL (ref 4.0–10.5)

## 2013-01-12 MED ORDER — RANITIDINE HCL 150 MG PO TABS: 150.0000 mg | ORAL_TABLET | Freq: Two times a day (BID) | ORAL | Status: DC

## 2013-01-12 MED ORDER — FLUOXETINE HCL 20 MG PO CAPS: 20.0000 mg | ORAL_CAPSULE | Freq: Every day | ORAL | Status: DC

## 2013-01-12 NOTE — Patient Instructions (Addendum)
It was great to see you again today!  For depression, I'm sending in a refill of your fluoxetine. If you have any thoughts of hurting yourself or anyone else, go to the Emergency Room to stay safe.  For your stomach, I'm sending in a prescription for ranitidine, which is an acid reflux medicine. You'll take it twice per day.  Keep up the work on cutting back on smoking!  Please schedule a follow-up visit in one month so we can see how your mood and stomach are doing.  For your toes, you can come back sooner to be seen for another appointment or call the clinic with any questions or concerns.  Be well, Dr. Pollie Meyer

## 2013-01-12 NOTE — Progress Notes (Signed)
Name: Rita Sanchez Age/Sex: 42 y.o. female  Patient is Spanish speaking; no live interpreter was available during this visit so phone interpreter was utilized for the entirety of the visit.  HPI:  Depression: patient previously took fluoxetine 20mg  daily for depression but has been out of this medicine for 1.5 months. She felt it helped when she was on the medicine. She reports that she now feels fat and sad. No current SI/HI (did have SI once 3-4 years ago). Denies there being a time during which she had a very elated mood in the past. Would like to restart the fluoxetine.  Ulcer: Pt previously a [facility] patient, and says that when she was a patient there they did a test where she breathed into a bag to inflate it, and from this they were able to tell her that she has an ulcer. Was taking omeprazole 20mg  BID but has been out for 1.5 mos. Has tried taking Tums, which has helped some but not as much as the omeprazole. She felt better when on the omeprazole, as she could eat spicy foods without discomfort. Has never been seen by GI or had an EGD. Denies ever having blood in her stool.  Headache: has had a mild headache in the evening for the past 15 days or so. Finds it hard to sleep. Has taken tylenol and advil which helped just a little. Has had some blurred vision and dizziness associated with the headache. It is on her forehead and in the back of her neck. Closing her eyes helps. She does not wear glasses - she saw an eye doctor recently who told her she only needs glasses when she reads. She thinks the headache might be due to depression. No fevers, no altered mental status.  At end of visit, patient brought up additional non-urgent concerns- we were out of time and I encouraged her to schedule another appointment to discuss these concerns. Pt was agreeable to this plan.  ROS: See HPI  PMFSH: Currently smoking 2-3 cigarettes per week, and is trying to work on cutting back even  more.  PHYSICAL EXAM: BP 107/70  Pulse 74  Temp(Src) 98.8 F (37.1 C) (Oral)  Ht 4\' 11"  (1.499 m)  Wt 165 lb (74.844 kg)  BMI 33.31 kg/m2 Gen: NAD, pleasant, cooperative Heart: RRR, ?2/6 early systolic murmur heard when sitting up, not when lying down Lungs: CTAB, NWOB Abd: soft, nontender to palpation in all quadrants Neuro: nonfocal - PERRL, EOMI, face symmetric and in tact to light touch bilaterally, SCM 5/5 bilaterally, shoulder shrug 5/5 bilaterally, grip 5/5 bilaterally, hip abduction 5/5 bilaterally Psych: normal speech rate and volume, normal eye contact, affect appropriate

## 2013-01-14 ENCOUNTER — Encounter: Payer: Self-pay | Admitting: Family Medicine

## 2013-01-14 DIAGNOSIS — R011 Cardiac murmur, unspecified: Secondary | ICD-10-CM | POA: Insufficient documentation

## 2013-01-14 NOTE — Assessment & Plan Note (Signed)
Auscultated on exam today, not previously noted. Will plan to re-auscultate at f/u visit in one month and consider workup at that time if still present.

## 2013-01-14 NOTE — Assessment & Plan Note (Signed)
Unclear if history pt gives of "breathing into a bag" and being diagnosed with an "ulcer" from this test was actually an H.Pylori breath test. No concerning findings on abdominal exam. As patient has never had an EGD I am unsure how an ulcer was actually diagnosed. Will be helpful to obtain records from [facility] to clarify this discrepancy. I suspect patient may actually have been treated for H Pylori and may just be having GERD currently.  Plan: - start H2 blocker (ranitidine) today - would like to avoid chronic PPI use if possible - check CBC today to screen for anemia/bleeding ulcer - follow up in one month to evaluate improvement of symptoms - request [facility] records at f/u visit

## 2013-01-14 NOTE — Assessment & Plan Note (Signed)
Pt c/o mild headache in evenings. No abnormal findings on neurological exam or red flags on history. I suspect headache may in large part be due to untreated depression and would expect it to improve if we restart her SSRI. For now, continue tylenol for symptom control.

## 2013-01-14 NOTE — Assessment & Plan Note (Signed)
Previously on fluoxetine 20mg  but has been out. Patient desires to restart this medicine. Will restart and plan to f/u in 1 month to monitor for improvement.

## 2013-01-14 NOTE — Assessment & Plan Note (Addendum)
Currently smoking 2-3 cigarettes per week, which is down from previously. Praised her efforts and encouraged smoking cessation.

## 2013-01-15 ENCOUNTER — Telehealth: Payer: Self-pay | Admitting: Family Medicine

## 2013-01-15 NOTE — Telephone Encounter (Signed)
To [facility] red team - can you please call this patient and advise her that she should only take tylenol for pain if needed for her headache? She had mentioned in our visit that she was using both tylenol and advil, but due to her possible ulcer, it is best if she only use tylenol and not any NSAIDs (like ibuprofen, advil, naproxen, etc). We may need to forward this to a Spanish speaking staff member, as she is spanish speaking only. Thank you!

## 2013-01-15 NOTE — Telephone Encounter (Signed)
Fwd. To [employer]. Thank you. Lorenda Hatchet, Renato Battles

## 2013-01-16 NOTE — Telephone Encounter (Signed)
I spoke with daughter and let her know Dr's instructions.  [employer]

## 2013-02-19 ENCOUNTER — Ambulatory Visit: Payer: No Typology Code available for payment source

## 2013-02-20 ENCOUNTER — Ambulatory Visit (INDEPENDENT_AMBULATORY_CARE_PROVIDER_SITE_OTHER): Payer: No Typology Code available for payment source | Admitting: Family Medicine

## 2013-02-20 ENCOUNTER — Encounter: Payer: Self-pay | Admitting: Family Medicine

## 2013-02-20 VITALS — BP 107/58 | HR 65 | Temp 98.6°F | Ht 59.0 in | Wt 162.0 lb

## 2013-02-20 DIAGNOSIS — K648 Other hemorrhoids: Secondary | ICD-10-CM

## 2013-02-20 DIAGNOSIS — K625 Hemorrhage of anus and rectum: Secondary | ICD-10-CM

## 2013-02-20 LAB — CBC
MCH: 31 pg (ref 26.0–34.0)
Platelets: 385 10*3/uL (ref 150–400)
RBC: 4.36 MIL/uL (ref 3.87–5.11)
RDW: 12.9 % (ref 11.5–15.5)

## 2013-02-20 NOTE — Progress Notes (Signed)
  Subjective:    Patient ID: Rita Sanchez, female    DOB: 1971/02/11, 42 y.o.   MRN: [id number]  HPI  Same day appointment for bleeding from rectum.  Started yesterday - patient noticed bright red bloody discharge after wiping and on stool.  She also saw blood in toilet bowl yesterday.  No blood on underwear.   She denies any pain with bowel movements.  Describes stool as normal, not hard or soft.  Denies any straining.  This is the first time patient has noticed bloody stool.  She has a hx of stomach ulcers, but blood is red, not black.  Denies any associated unintentional weight loss, fever, nausea/vomiting.  No family hx of colon cancer.   Review of Systems Per HPI    Objective:   Physical Exam  Constitutional: She appears well-nourished. No distress.  Abdominal: Soft. Bowel sounds are normal. She exhibits no distension. There is no tenderness. There is no rebound and no guarding.  Genitourinary: Rectal exam shows no external hemorrhoid, no fissure, no tenderness and anal tone normal.  Anoscopy: revealed friable internal hemorrhoids with scant active bleeding  Hemoccult positive.     Assessment & Plan:

## 2013-02-20 NOTE — Patient Instructions (Addendum)
Purchase over the counter Anusol-HC and use as directed. Increase fiber in your diet - fruits and vegetables with every meal. You may also purchase fiber supplements over the counter. If bleeding worsens in the next few weeks, please return to clinic.  Hemorroides  (Hemorrhoids) Las hemorroides son venas agrandadas (dilatadas) alrededor del recto. Hay 2 tipos de hemorroides, que se determina por su ubicacin. Las hemorroides internas, que son las que se encuentran dentro del recto.Generalmente no duelen, Charity fundraiser.Sin embargo, pueden hincharse y salir por el recto, entonces se irritan y duelen. Las hemorroides externas incluyen las venas externas del ano, y se sienten como un bulto duro y que duele, cerca del ano.Muchas veces pican, y pueden romperse y Geophysicist/field seismologist. En algunos casos se forman cogulos en las venas. Esto hace que se hinchen y duelan. Esto se suele denominar trombosis hemorroidal.  CAUSAS  Las causas de hemorroides son:   Vanetta Mulders. El embarazo aumenta la presin en las venas hemorroidales.  Constipacin.  Dificultad para mover el intestino.  Obesidad.  Levantar pesas u otras actividades que impliquen esfuerzo. TRATAMIENTO  La mayora de los casos de hemorroides mejoran en 1 a 2 semanas. Sin embargo, si los sntomas no mejoran o tiene Runner, broadcasting/film/video, el mdico puede Education officer, environmental un procedimiento para disminuir las hemorroides o extirparlas completamente.Los tratamientos posibles son:   Abigail Butts con Neomia Dear banda de goma. Se coloca una banda de goma en la base de la hemorroides para cortar la circulacin.  Escleroterapia Se inyecta una sustancia qumica para disminuir el tamao de la hemorroides.  Terapia con luz infrarroja. Se utiliza un instrumento para quemar la hemorroides.  Hemorroidectoma. Es la remocin [place] de la hemorroides. INSTRUCCIONES PARA EL CUIDADO EN EL HOGAR   Agregue fibra a su dieta. Consulte con su mdico acerca del uso de suplementos con  fibras.  Beba gran cantidad de lquido para mantener la orina de tono claro o color amarillo plido.  Haga ejercicios regularmente.  Vaya al bao cuando sienta la necesidad de mover el intestino. No espere.  Evite hacer fuerza al mover el intestino.  Mantenga la zona anal limpia y seca.  Solo tome medicamentos que se pueden comprar sin receta o recetados para Chief Technology Officer, Dentist o fiebre, como le indica el mdico. Si la hemorroides est trombosada:   Tome baos de asiento calientes durante 20 a 30 minutos, 3 a 4 veces por da.  Si le duele y est hinchada, coloque compresas con hielo en la zona, segn la tolerancia. Usar las compresas de [employer] baos de asiento puede ser [place]. Llene una bolsa plstica con hielo. Coloque una toalla entre la bolsa de hielo y la piel.  Puede usar o Contractor segn las indicaciones algunas cremas especiales y supositorios.  No utilice una almohada en forma de aro ni se siente en el inodoro durante perodos prolongados. Esto aumenta la afluencia de sangre y Chief Technology Officer. SOLICITE ATENCIN MDICA SI:   Aumenta el dolor y la hinchazn, y no puede controlarlo con Tourist information centre manager.  Tiene un sangrado que no puede parar.  No puede mover el intestino.  Siente dolor o tiene inflamacin fuera de la zona de las hemorroides.  Tiene escalofros o una temperatura oral mayor a 102 F (38.9 C). ASEGRESE DE QUE:   Comprende estas instrucciones.  Controlar su enfermedad.  Solicitar ayuda de inmediato si no mejora o si empeora. Document Released: 10/04/2005 Document Revised: 04/04/2012 [facility] Patient Information 2013 [place], [place].

## 2013-02-20 NOTE — Assessment & Plan Note (Signed)
Internal hemorrhoids confirmed on anoscopy.  Will check CBC today, although no family hx of colon cancer.  Recommended OTC anusol-HC cream and increasing fiber in diet.  If bleeding does not improve, could consider referral to surgery, but patient has an orange card.  Follow up if symptoms worsen.

## 2013-02-22 ENCOUNTER — Encounter: Payer: Self-pay | Admitting: Family Medicine

## 2013-02-22 ENCOUNTER — Ambulatory Visit (INDEPENDENT_AMBULATORY_CARE_PROVIDER_SITE_OTHER): Payer: No Typology Code available for payment source | Admitting: Family Medicine

## 2013-02-22 VITALS — BP 111/73 | HR 85 | Temp 98.5°F | Ht 59.0 in | Wt 163.0 lb

## 2013-02-22 DIAGNOSIS — R011 Cardiac murmur, unspecified: Secondary | ICD-10-CM

## 2013-02-22 DIAGNOSIS — K279 Peptic ulcer, site unspecified, unspecified as acute or chronic, without hemorrhage or perforation: Secondary | ICD-10-CM

## 2013-02-22 DIAGNOSIS — Z7189 Other specified counseling: Secondary | ICD-10-CM

## 2013-02-22 DIAGNOSIS — Z716 Tobacco abuse counseling: Secondary | ICD-10-CM

## 2013-02-22 DIAGNOSIS — F329 Major depressive disorder, single episode, unspecified: Secondary | ICD-10-CM

## 2013-02-22 DIAGNOSIS — K219 Gastro-esophageal reflux disease without esophagitis: Secondary | ICD-10-CM

## 2013-02-22 DIAGNOSIS — F17200 Nicotine dependence, unspecified, uncomplicated: Secondary | ICD-10-CM

## 2013-02-22 MED ORDER — OMEPRAZOLE 20 MG PO CPDR: 20.0000 mg | DELAYED_RELEASE_CAPSULE | Freq: Every day | ORAL | Status: DC

## 2013-02-22 MED ORDER — FLUOXETINE HCL 20 MG PO CAPS: 20.0000 mg | ORAL_CAPSULE | Freq: Every day | ORAL | Status: DC

## 2013-02-22 NOTE — Progress Notes (Signed)
Patient ID: Rita Sanchez, female   DOB: 09-12-1971, 42 y.o.   MRN: [id number]  HPI:  Ulcer: pt had stopped PPI and tried h2 blocker (ranitidine BID) but says the ranitidine did not help at all. She only took it for a little over a week. She gets epigastric pain especially when she eats spicy foods. Desires to go back on PPI as it helped her a lot more.  Depression: reports mood has been good. Denies SI/HI. Happy with her current dose of prozac, does not think she needs to increase.  Rectal bleeding: came in earlier this week with rectal bleeding and was diagnosed with hemorrhoids. Has not had any further bleeding since then.   ROS: See HPI. + epigastric pain. Denies SOB or chest pain.  PMFSH: Smoking 2 cigarettes per day.  PHYSICAL EXAM: BP 111/73  Pulse 85  Temp(Src) 98.5 F (36.9 C) (Oral)  Ht 4\' 11"  (1.499 m)  Wt 163 lb (73.936 kg)  BMI 32.9 kg/m2  LMP 02/01/2013 Gen: NAD Heart: RRR, no murmurs auscultated Abd: normal bowel sounds. Abdomen is soft, nontender to palpation in all quadrants. Obese. Lungs: CTAB Neuro: nonfocal grossly, speech intact Psych: affect pleasant. Speech normal in rate and volume. Normal eye contact. Well groomed. Does not appear to be responding to internal stimuli.  ASSESSMENT/PLAN:  # Health maintenance:  -due for pap in 1 mo - pt to schedule

## 2013-02-22 NOTE — Assessment & Plan Note (Signed)
Encouraged smoking cessation 

## 2013-02-22 NOTE — Assessment & Plan Note (Signed)
Not auscultated on exam today - if present, it is very quiet. Pt denies SOB or chest pain. Will continue to examine at future visits.

## 2013-02-22 NOTE — Assessment & Plan Note (Signed)
Symptoms not controlled with H2 blocker. Will check h pylori urea breath test today as I suspect she has had it before. Will rx omeprazole and plan to f/u in 1 month. If h pylori test is positive, will need treatment with triple therapy.  Will need to request [facility] records but forgot to take care of this before patient left- will ask admin team to send letter to pt so she can sign a release form to obtain records. I am interested to see if she has ever had an EGD or been treated for h pylori in the past.

## 2013-02-22 NOTE — Patient Instructions (Signed)
It was great to see you again today!  We are doing a breath test today to see if you have a bacteria which could cause problems with your stomach. I will call you if your test results are not normal.  Otherwise, I will send you a letter.  If you do not hear from me with in 2 weeks please call our office.     I have refilled your prozac and the stomach medicine (omeprazole).  Come back for a visit in one month to follow up on your stomach problems and for a pap smear.  Be well, Dr. Pollie Meyer

## 2013-02-22 NOTE — Assessment & Plan Note (Signed)
Stable - will continue current management.

## 2013-02-26 ENCOUNTER — Encounter: Payer: Self-pay | Admitting: Family Medicine

## 2013-02-26 LAB — H. PYLORI BREATH TEST: H. pylori Breath Test: NEGATIVE

## 2013-02-27 ENCOUNTER — Encounter: Payer: Self-pay | Admitting: Family Medicine

## 2013-03-05 ENCOUNTER — Ambulatory Visit: Payer: No Typology Code available for payment source

## 2013-04-12 ENCOUNTER — Ambulatory Visit (INDEPENDENT_AMBULATORY_CARE_PROVIDER_SITE_OTHER): Payer: No Typology Code available for payment source | Admitting: Family Medicine

## 2013-04-12 VITALS — BP 134/78 | HR 61 | Temp 97.9°F | Wt 157.0 lb

## 2013-04-12 DIAGNOSIS — M549 Dorsalgia, unspecified: Secondary | ICD-10-CM

## 2013-04-12 MED ORDER — METHYLPREDNISOLONE ACETATE 40 MG/ML IJ SUSP
40.0000 mg | Freq: Once | INTRAMUSCULAR | Status: AC
  Administered 2013-04-12: 40 mg via INTRA_ARTICULAR

## 2013-04-12 MED ORDER — ACETAMINOPHEN ER 650 MG PO TBCR: 650.0000 mg | EXTENDED_RELEASE_TABLET | Freq: Three times a day (TID) | ORAL | Status: DC | PRN

## 2013-04-12 MED ORDER — CYCLOBENZAPRINE HCL 10 MG PO TABS: 10.0000 mg | ORAL_TABLET | Freq: Three times a day (TID) | ORAL | Status: DC | PRN

## 2013-04-12 NOTE — Progress Notes (Signed)
Subjective:     Patient ID: Rita Sanchez, female   DOB: [birth year], 42 y.o.   MRN: [id number]  HPI 42 year old Hispanic female presents with a Spanish interpreter for same-day visit to discuss the following:  Right-sided low back pain: Patient has a history of right-sided low back pain. She was last seen for this 6 months ago. Over the past 5 years had 5 episodes of acute back pain. This episode started 5 days ago while she was working. She works as a Advertising copywriter. The pain started at rest right-sided radiating to the gluteal area, down the leg and anteriorly to the groin. Pain is 7/10 severity. Patient has tried topical pain relief cream without relief. Patient denies dysuria blood in her urine. Pain has been unchanged since onset.  Review of Systems As per HPI     Objective:   Physical Exam BP 134/78  Pulse 61  Temp(Src) 97.9 F (36.6 C) (Oral)  Wt 157 lb (71.215 kg)  BMI 31.69 kg/m2  LMP 03/27/2013 General appearance: alert, cooperative and no distress Back: Normal Curvature, no deformities or CVA tenderness  Paraspinal Tenderness: R lumbar  With Palpable trigger point and muscle spasm. LE Strength 5/5  LE Sensation: in tact  LE Reflexes 2+ and symmetric  Positive straight leg raise on L and crossed straight leg raise on R   After obtaining informed consent a steroid injection was performed at right lumbar paraspinal trigger point using 3 ML's of 2% plain Lidocaine and 40 mg of DepoMedrol. This was well tolerated.  Patient reported improvement in the severity of her pain.     Assessment and Plan:

## 2013-04-12 NOTE — Patient Instructions (Addendum)
Rita Sanchez,  Gracias por venido hoy.  Por dolor tome tylenol 650 mg cada seis hora.  o tome flexeril cada ocho horas.   regresar en una semana con [name].   Dr. Armen Pickup

## 2013-04-12 NOTE — Assessment & Plan Note (Signed)
A: recurrent right side the right-sided lumbar back pain with sciatica. Patient's partner well to trigger point injection. P: Patient provided with Flexeril to take as needed. Patient encouraged to take scheduled Tylenol. Patient was not provided an NSAID due to her history of peptic ulcers. Patient will follow up with her PCP in one week. Recommend patient be referred to physical therapy to prevent recurrent episodes.

## 2013-04-24 ENCOUNTER — Ambulatory Visit (INDEPENDENT_AMBULATORY_CARE_PROVIDER_SITE_OTHER): Payer: No Typology Code available for payment source | Admitting: Family Medicine

## 2013-04-24 ENCOUNTER — Encounter: Payer: Self-pay | Admitting: Family Medicine

## 2013-04-24 VITALS — BP 102/60 | HR 73 | Temp 98.1°F | Ht 59.0 in | Wt 158.0 lb

## 2013-04-24 DIAGNOSIS — K279 Peptic ulcer, site unspecified, unspecified as acute or chronic, without hemorrhage or perforation: Secondary | ICD-10-CM

## 2013-04-24 DIAGNOSIS — F329 Major depressive disorder, single episode, unspecified: Secondary | ICD-10-CM

## 2013-04-24 MED ORDER — OMEPRAZOLE 20 MG PO CPDR: 20.0000 mg | DELAYED_RELEASE_CAPSULE | Freq: Every day | ORAL | Status: DC

## 2013-04-24 MED ORDER — FLUOXETINE HCL 40 MG PO CAPS: 40.0000 mg | ORAL_CAPSULE | Freq: Every day | ORAL | Status: DC

## 2013-04-24 NOTE — Patient Instructions (Signed)
It was great to see you again today!  For depression, let's increase your prozac to 40mg  daily. I have sent in this prescription for you. If you have any thoughts of hurting yourself or anyone else, go to the Emergency Room to stay safe.  I refilled your stomach medicine as well.  Make sure you drink plenty of fluids and are careful when you stand up. Schedule an appointment to come back in 2 weeks so we can follow up on how your mood is doing. We can do your pap at that time as well.  Be well, Dr. Pollie Meyer

## 2013-04-25 NOTE — Progress Notes (Signed)
Patient ID: Rita Sanchez, female   DOB: [birth year], 42 y.o.   MRN: [id number]  HPI:  Depression follow up: pt is not sure that her prozac is helping. She endorses a lot of anxiety, especially at night. She is able to sleep but just feels anxious. Endorses feeling sometimes that she might not want to be alive, but denies any specific intent or plan to harm herself. No thoughts of harming others. She is interested in going up on the prozac.  GERD: reports abdominal pain is significantly better with PPI. H pylori testing from last visit was negative.   Hypotension: noted on vitals today, initial BP 93/51. Pt denies any hx of falls. Occasionally feels lightheaded with standing quickly. Drinks plenty of water.   ROS: See HPI  PMFSH: Hx of back pain, recently got trigger point injection which made her feel much better  PHYSICAL EXAM: BP 102/60  Pulse 73  Temp(Src) 98.1 F (36.7 C) (Oral)  Ht 4\' 11"  (1.499 m)  Wt 158 lb (71.668 kg)  BMI 31.89 kg/m2  LMP 03/27/2013 Gen: NAD Heart: RRR Lungs: CTAB, NWOB Abd: very mild tenderness in epigastric area, otherwise NTTP Neuro: grossly nonfocal, speech intact, able to stand without significant dizziness Psych: affect shows full range. Well groomed. Normal eye contact. Speech normal in rate and volume.  ASSESSMENT/PLAN:  # Health maintenance:  -Pt to schedule appt for pap smear in several weeks since she is menstruating right now and prefers not to have pap today -Also will be due for lipid & diabetes screening at that visit  # Hypotension: Noted on vitals today. Pt in NAD and is able to stand and ambulate without significant dizziness or risk of falling. BP improved on recheck to 102/60. Advised caution with standing quickly, and continue drinking plenty of fluids.

## 2013-04-29 NOTE — Assessment & Plan Note (Signed)
Attempted to have pt fill out medical record request to obtain [facility] records today, but there is no longer a phone number listed for [facility], which has now been closed for almost 1 year. Had nursing staff advise pt to try and go there directly to obtain her records and bring them to [place]. Symptoms are well controlled at this time with PPI, so will continue this medicine. I am still unsure if pt ever truly had an ulcer or just has symptomatic GERD. She may benefit from GI referral but first I would like to try to obtain her previous records.

## 2013-04-29 NOTE — Assessment & Plan Note (Signed)
Pt reports symptoms of uncontrolled anxiety and occasional passive SI. She is able to contract for safety today and denies any specific intention or plan to harm herself. Protective factors include having family members in her life whom she cares for. Will increase prozac to 40mg  daily and have pt f/u in 2 weeks to monitor for improvement and be sure she is tolerating this dose.

## 2013-05-07 ENCOUNTER — Other Ambulatory Visit (HOSPITAL_COMMUNITY)
Admission: RE | Admit: 2013-05-07 | Discharge: 2013-05-07 | Disposition: A | Discharge status: Home / Self Care | Payer: Self-pay | Source: Ambulatory Visit | Attending: Family Medicine | Admitting: Family Medicine

## 2013-05-07 ENCOUNTER — Encounter: Payer: Self-pay | Admitting: Family Medicine

## 2013-05-07 ENCOUNTER — Ambulatory Visit (INDEPENDENT_AMBULATORY_CARE_PROVIDER_SITE_OTHER): Payer: Self-pay | Admitting: Family Medicine

## 2013-05-07 VITALS — BP 102/70 | HR 67 | Ht 59.0 in | Wt 160.0 lb

## 2013-05-07 DIAGNOSIS — L989 Disorder of the skin and subcutaneous tissue, unspecified: Secondary | ICD-10-CM

## 2013-05-07 DIAGNOSIS — F329 Major depressive disorder, single episode, unspecified: Secondary | ICD-10-CM

## 2013-05-07 DIAGNOSIS — Z1151 Encounter for screening for human papillomavirus (HPV): Secondary | ICD-10-CM | POA: Insufficient documentation

## 2013-05-07 DIAGNOSIS — Z113 Encounter for screening for infections with a predominantly sexual mode of transmission: Secondary | ICD-10-CM | POA: Insufficient documentation

## 2013-05-07 DIAGNOSIS — Z124 Encounter for screening for malignant neoplasm of cervix: Secondary | ICD-10-CM

## 2013-05-07 DIAGNOSIS — Z01419 Encounter for gynecological examination (general) (routine) without abnormal findings: Secondary | ICD-10-CM | POA: Insufficient documentation

## 2013-05-07 DIAGNOSIS — N898 Other specified noninflammatory disorders of vagina: Secondary | ICD-10-CM

## 2013-05-07 DIAGNOSIS — R238 Other skin changes: Secondary | ICD-10-CM

## 2013-05-07 LAB — POCT WET PREP (WET MOUNT)

## 2013-05-07 NOTE — Patient Instructions (Signed)
It was great to see you again today!  Let's continue the same dose of your medicine. Come back in one month to follow up on your mood. If you have any thoughts of hurting yourself or anyone else, go to the Emergency Room to stay safe.  I will call you if your test results are not normal.  Otherwise, I will send you a letter.  If you do not hear from me with in 2 weeks please call our office.      Be well, Dr. Pollie Meyer

## 2013-05-07 NOTE — Progress Notes (Signed)
Patient ID: Rita Sanchez, female   DOB: 01-17-1971, 42 y.o.   MRN: [id number]   HPI:  Depression: Denies SI/HI. Anxiety has persisted despite increase in dose of prozac, but she thinks it might be a little better. She is tolerating the increased dose.   Well woman visit: last pap was 3 years ago. Has no history of abnormal paps per patient. Gets periods monthly. They last 8 days, which is new for her, as they used to last 3 days. She gets four days of normal bleeding followed by four days of light bleeding. Does get bloating and pain with periods. Last period was 2 weeks ago. Has had BTL >23 years ago for contraception. Denies discharge. LLQ pain noted on exam- has noticed for about 3-4 months. Comes and goes with periods. Never used to have pain or cramps. Sexually active with one female partner who also is active with several other women. This has been her only partner for the last several years.  Bumps on buttock: Reports that has gotten some bumps on her backside just above her gluteal cleft. They have come and gone. She can squeeze them and get pus out sometimes. Has picked at them. No hx of needing I&D or abx for them. No fevers.   ROS: See HPI  PMFSH: hx depression, acid reflux vs. Peptic ulcer  PHYSICAL EXAM: BP 102/70  Pulse 67  Ht 4\' 11"  (1.499 m)  Wt 160 lb (72.576 kg)  BMI 32.3 kg/m2  LMP 03/27/2013 Gen: NAD Lungs: NWOB Neuro: grossly nonfocal, speech intact, moves all 4 extremities GU: normal appearing external genitalia. Vagina is moist. Mild clearish white discharge present. Cervix normal in appearance. Some tenderness on bimanual exam in LLQ. No definite cervical motion tenderness. No adnexal masses palpable. At most superior aspect of gluteal cleft there are a few areas of excoriation in various stages of healing. No drainage or pus noted. nontender to palpation. No induration or fluctuance.  ASSESSMENT/PLAN:  # Health maintenance:  -pap smear done today, with  reflex HPV. Also ordered HIV, RPR, gc/chlamydia, and wet prep for STD screening. -Mild LLQ tenderness noted on exam today. No masses palpable. Pt reports this pain is mild and present just with her periods, however she is not currently menstruating. Advised that she return if the pain is persisting as we may need to investigate this further.  # Buttock bumps: -appear to be pimples/acne. No signs of infection currently. Advised pt avoid picking at them, as they appear irritated from chronic picking. F/u prn.

## 2013-05-08 LAB — RPR

## 2013-05-11 ENCOUNTER — Encounter: Payer: Self-pay | Admitting: Family Medicine

## 2013-05-12 NOTE — Assessment & Plan Note (Addendum)
Tolerating increased dose of prozac. Has had some improvement in symptoms, but anxiety persists. No evidence of SI/HI. Will have pt continue this dose for another month, and follow up. Advised that dose changes do not always improve symptoms immediately, and that it may take several weeks before she seems more of a difference. If not experiencing improvement a f/u visit, will discuss switching to zoloft for better anxiety treatment.

## 2013-05-31 ENCOUNTER — Ambulatory Visit: Payer: No Typology Code available for payment source

## 2013-06-12 ENCOUNTER — Other Ambulatory Visit: Payer: Self-pay | Admitting: Family Medicine

## 2013-06-13 ENCOUNTER — Telehealth: Payer: Self-pay | Admitting: Family Medicine

## 2013-06-13 NOTE — Telephone Encounter (Signed)
Forward to [employer] to call spanish speaking patient with message from Dr Pollie Meyer.Rita Sanchez, Rita Sanchez

## 2013-06-13 NOTE — Telephone Encounter (Signed)
To [facility] red team - please call pt and let her know I have refilled one month's worth of her prozac but she needs to schedule an appointment to follow up on her depression/anxiety. Thanks!

## 2013-06-14 NOTE — Telephone Encounter (Signed)
I called but unable to LVM   [employer]

## 2013-07-11 ENCOUNTER — Ambulatory Visit (INDEPENDENT_AMBULATORY_CARE_PROVIDER_SITE_OTHER): Payer: No Typology Code available for payment source | Admitting: Family Medicine

## 2013-07-11 VITALS — BP 104/66 | HR 69 | Temp 98.6°F | Ht 59.0 in | Wt 162.0 lb

## 2013-07-11 DIAGNOSIS — R61 Generalized hyperhidrosis: Secondary | ICD-10-CM

## 2013-07-11 DIAGNOSIS — Z13220 Encounter for screening for lipoid disorders: Secondary | ICD-10-CM

## 2013-07-11 DIAGNOSIS — F329 Major depressive disorder, single episode, unspecified: Secondary | ICD-10-CM

## 2013-07-11 DIAGNOSIS — IMO0001 Reserved for inherently not codable concepts without codable children: Secondary | ICD-10-CM

## 2013-07-11 MED ORDER — FLUOXETINE HCL 40 MG PO CAPS: ORAL_CAPSULE | ORAL | Status: DC

## 2013-07-11 NOTE — Patient Instructions (Addendum)
It was great to see you again today!  On your way out, schedule an appointment one morning to come back for fasting labs. Do not eat or drink anything other than water the morning of your lab appointment until after your labs are drawn. We will check your cholesterol and thyroid.  Return in 1-2 weeks to address the nail problem. I sent in 3 months worth of your depression medicine.  Be well, Dr. Pollie Meyer

## 2013-07-11 NOTE — Progress Notes (Signed)
Patient ID: Rita Sanchez, female   DOB: [birth year], 42 y.o.   MRN: [id number]   HPI:  Depression/anxiety: Reports she is feeling well on her current dose of prozac. Anxiety is better controlled. She is sleeping well. Denies SI/HI.   Headache: about fifteen days ago, she had a headache which lasted a week, then spontaneously resolved. Denies vision changes or weakness in her body. She gets cold sweats in the afternoon a few times per day. Denies night sweats or cough.   ROS: See HPI  PMFSH: hx depression  PHYSICAL EXAM: BP 104/66  Pulse 69  Temp(Src) 98.6 F (37 C) (Oral)  Ht 4\' 11"  (1.499 m)  Wt 162 lb (73.483 kg)  BMI 32.7 kg/m2 Gen: NAD HEENT: MMM, no anterior cervical LAD or thyromegaly Heart: RRR Lungs: CTAB Neuro: grossly nonfocal, speech intact, face symmetric, tongue protrudes midline, PERRL, strength 5/5 in all extremities  ASSESSMENT/PLAN:  # Health maintenance:  -Pt will return for AM fasting lipid panel for screening for hyperlipidemia.   # Headache: Unclear etiology, but no abnormalities on neurological exam, and headache has since spontaneously resolved. Will continue to monitor for recurrence of headache. Given that she endorses sweats, will check TSH to rule out thyroid dysfunction.  Note: At end of visit pt also c/o fungus on her toes, advised that she needs a separate appointment to discuss this. appt scheduled for pt prior to her leaving, will notify front desk re: need for interpreter. Will also add on LFT's to her labwork so that we have them in the event that we need to do a systemic antifungal, also so we have them for baseline in event that she needs statin.  See problem based charting for additional assessment/plan.   FOLLOW UP: F/u in 3 months for depression

## 2013-07-16 NOTE — Assessment & Plan Note (Signed)
Improved on current dose of prozac. Will continue with this management. F/u in 3 months.

## 2013-07-18 ENCOUNTER — Ambulatory Visit (INDEPENDENT_AMBULATORY_CARE_PROVIDER_SITE_OTHER): Payer: No Typology Code available for payment source | Admitting: Family Medicine

## 2013-07-18 ENCOUNTER — Encounter: Payer: Self-pay | Admitting: Family Medicine

## 2013-07-18 VITALS — BP 101/68 | HR 64 | Temp 98.1°F | Wt 156.5 lb

## 2013-07-18 DIAGNOSIS — B351 Tinea unguium: Secondary | ICD-10-CM

## 2013-07-18 DIAGNOSIS — Z13220 Encounter for screening for lipoid disorders: Secondary | ICD-10-CM

## 2013-07-18 DIAGNOSIS — IMO0001 Reserved for inherently not codable concepts without codable children: Secondary | ICD-10-CM

## 2013-07-18 DIAGNOSIS — B353 Tinea pedis: Secondary | ICD-10-CM

## 2013-07-18 DIAGNOSIS — R61 Generalized hyperhidrosis: Secondary | ICD-10-CM

## 2013-07-18 LAB — LIPID PANEL
Cholesterol: 220 mg/dL — ABNORMAL HIGH (ref 0–200)
LDL Cholesterol: 150 mg/dL — ABNORMAL HIGH (ref 0–99)
Total CHOL/HDL Ratio: 4.9 Ratio
VLDL: 25 mg/dL (ref 0–40)

## 2013-07-18 LAB — COMPREHENSIVE METABOLIC PANEL
ALT: 14 U/L (ref 0–35)
Albumin: 4.1 g/dL (ref 3.5–5.2)
Alkaline Phosphatase: 41 U/L (ref 39–117)
Glucose, Bld: 89 mg/dL (ref 70–99)
Potassium: 3.8 mEq/L (ref 3.5–5.3)
Sodium: 136 mEq/L (ref 135–145)
Total Protein: 6.5 g/dL (ref 6.0–8.3)

## 2013-07-18 MED ORDER — TERBINAFINE HCL 250 MG PO TABS: 250.0000 mg | ORAL_TABLET | Freq: Every day | ORAL | Status: DC

## 2013-07-18 NOTE — Addendum Note (Signed)
Addended by: Latrelle Dodrill on: 07/18/2013 09:34 AM   Modules accepted: Orders

## 2013-07-18 NOTE — Progress Notes (Signed)
Patient ID: Rita Sanchez, female   DOB: 06-13-1971, 42 y.o.   MRN: [id number]  HPI:  Foot fungus: Patient presents to discuss fungus on her feet. Has noted nail thickness and discoloration for the last 3 years. This has been worsening. She has not seen a physician for this in the past, but has been applying garlic at home as someone instructed her to do this as a home remedy. The garlic hurts her feet. Her feet have been itchy. She has areas of infection in between her toes and she states she cannot wear open shoes or her feet will bleed.  ROS: See HPI  PMFSH: History of depression and acid reflux  PHYSICAL EXAM: BP 101/68  Pulse 64  Temp(Src) 98.1 F (36.7 C) (Oral)  Wt 156 lb 8 oz (70.988 kg)  BMI 31.59 kg/m2  LMP 07/07/2013 Gen: No acute distress Lungs: Normal respiratory effort Neuro: Grossly nonfocal, speech intact Extremities: 2+ DP pulses bilaterally. There is thickening and discoloration of the right great toenail, left great toenail, and left fifth toenail. There is a whitish substance present in between the fourth and fifth toes on the skin bilaterally. No other skin breakdown or signs of superinfection.  ASSESSMENT/PLAN:  # Onychomycosis and tinea pedis: Appearance of nails and interdigital spaces on the feet consistent with onychomycosis with tinea pedis as well. -Will check complete metabolic panel today to establish baseline LFTs -Will treat with 12 weeks of oral terbinafine -Discussed with patient and this will be a slow process of recovery, and she may not see any improvement in her nails for at least a year until the nails grow out. -Advised that she keep her feet aerated to allow tinea pedis to heal. -Precepted with Dr. Mauricio Po, who also examined patient and agrees with this plan.  FOLLOW UP: F/u in 3 months for depression as instructed at visit last week.   Latrelle Dodrill, MD

## 2013-07-18 NOTE — Patient Instructions (Addendum)
It was great to see you again today!  I sent in a medicine for your feet. We are checking bloodwork today. I will call you if your test results are not normal.  Otherwise, I will send you a letter.  If you do not hear from me with in 2 weeks please call our office.      See the handout below. STOP using the garlic on your feet. Follow up in 3 months for depression, or sooner if you have any problems.  Be well, Dr. Carlis Stable de las uas (Ringworm, Nail) Usted presenta una infeccin por hongos en las uas de los pies. La parte visible de las uas est formada por clulas muertas que no tienen suministro sanguneo que intervenga en la prevencin de las infecciones. La infeccin se produce debido a que los hongos estn en todas partes. Aprovecharn cualquier oportunidad para crecer Presenter, broadcasting. Esto incluye los tejidos de su cuerpo formados por [employer].  [place] uas tienen un crecimiento muy lento, requieren Copan 2 aos de tratamiento con medicamentos antimicticos. La infeccin involucra a toda la ua, hasta la base. Incluye aproximadamente 1/3 de la ua que no puede verse. Si el profesional le ha prescrito un medicamento por boca, [employer]. No podr ver ningn progreso hasta que hayan transcurrido entre 6 y 9 meses. No debe preocuparse. La curacin es lenta. Se debe a que el medicamento llega hasta la infeccin de manera muy lenta. Los hongos pueden vivir sobre las clulas muertas con poca o casi ninguna exposicin al suministro de [place]. Esta tambin es la razn por la cual no se observa mejora en los primeros 6 meses. La ua comienza la curacin en la base, donde hay suministro de [place]. La medicacin tpica, como las cremas y los ungentos generalmente no son eficaces. Channing Mutters al profesional podrn elegir acelerar el proceso de curacin con la extraccin quirrgica de todas las uas. An as, Pharmacist, hospital 6 y 9 meses de medicamentos por va  oral adicionales. Concurra a la Training and development officer profesional que lo asiste de acuerdo a lo que le haya indicado. Recuerde que no observar mejora durante al menos 6 meses. Consulte antes con el profesional si aparecen otros signos de infeccin (p. ej. enrojecimiento e hinchazn). Document Released: 07/14/2005 Document Revised: 12/27/2011 [facility] Patient Information 2014 [place], [place].  Pie de atleta (Athlete's Foot) El pie de atleta (tinea pedis) es una infeccin por hongos en la piel de los pies. Generalmente aparece en la piel que se encuentra entre los dedos o debajo de los mismos. Tambin puede aparecer en la planta de los pies. El pie de atleta se produce con ms frecuencia cuando el clima es clido y hmedo. La falta de higiene en los pies o no cambiarse los calcetines con frecuencia puede contribuir a la aparicin del pie de atleta. La infeccin puede transmitirse de [place] persona a otra (escontagiosa).  CAUSAS La causa del pie de atleta es un hongo. Este hongo se desarrolla en lugares clidos y hmedos. La [employer]son de las personas se contagian al compartir duchas, toallas y pisos mojados con una persona infectada. Las personas con el sistema inmunolgico dbil, incluidas la personas con diabetes, son ms propensas al pie de atleta. SNTOMAS  Picazn entre los dedos o en las plantas de los pies.  Aparecen zonas blanquecinas o escamosas entre los dedos o las plantas de los pies.  Pueden aparecer tambin pequeas ampollas que producen una picazn intensa.  Pequeos cortes en la piel. En estos cortes puede desarrollarse una infeccin bacteriana.  Las uas de los pies pueden volverse ms finas o descoloridas. DIAGNSTICO El mdico puede diagnosticar el problema haciendo un examen fsico. Tambin podr tomar Lauris Poag de piel de la zona de la erupcin. La muestra de piel es examinada en el microscopio o puede realizarse una prueba para ver si se desarrollan hongos. Tambin podr tomarse una  muestra de la ua para Chiropractor.  TRATAMIENTO Para eliminar los hongos se utilizan medicamentos de venta libre o recetados. Estos medicamentos estn disponibles en forma de polvos o cremas. El mdico podr sugerirle medicamentos. Las infecciones por hongos responden lentamente al Mattel. Puede ser que necesite usar el medicamento durante varias semanas.  PREVENCIN   No comparta toallas.  Use sandalias cuando tenga que pisar zonas hmedas. como duchas y vestuarios compartidos.  Mantenga sus pies secos. Use zapatos que permitan la circulacin de aire. Use calcetines de algodn o lana. INSTRUCCIONES PARA EL CUIDADO DOMICILIARIO  Tome todos los medicamentos segn le indic su mdico. No se aplique cremas con corticoides en el pie de atleta.  Mantenga los pies limpios y frescos. Lave sus pies todos los [address] y squelos cuidadosamente, [employer] dedos.  Cmbiese los calcetines [employer]. Use calcetines de algodn o lana. En climas clidos puede ser necesario cambiarse los calcetines 2  3 veces por da.  Use sandalias o zapatillas de lona que tengan buena ventilacin.  Si tiene ampollas, remoje los pies en solucin de [place] o sales de [place] durante 20 a 30 minutos 2 veces por da para [employer]. Luego asegrese de [employer]. SOLICITE ATENCIN MDICA SI:  Lance Muss.  Aumenta la hinchazn, el dolor, el calor o el enrojecimiento en el pie.  No mejora luego de 7 [address]  No se cura completamente luego de 30 das.  Tiene problemas relacionados con los medicamentos. EST SEGURO QUE:   Comprende las instrucciones para el alta mdica.  Controlar su enfermedad.  Solicitar atencin mdica de inmediato segn las indicaciones. Document Released: 10/04/2005 Document Revised: 12/27/2011 [facility] Patient Information 2014 [place], [place].

## 2013-07-26 ENCOUNTER — Encounter: Payer: Self-pay | Admitting: Family Medicine

## 2013-08-08 ENCOUNTER — Ambulatory Visit: Payer: No Typology Code available for payment source | Admitting: Family Medicine

## 2013-08-10 ENCOUNTER — Ambulatory Visit (INDEPENDENT_AMBULATORY_CARE_PROVIDER_SITE_OTHER): Payer: No Typology Code available for payment source | Admitting: Family Medicine

## 2013-08-10 ENCOUNTER — Encounter: Payer: Self-pay | Admitting: Family Medicine

## 2013-08-10 VITALS — BP 113/74 | HR 66 | Temp 98.0°F | Ht 59.0 in | Wt 160.0 lb

## 2013-08-10 DIAGNOSIS — Z23 Encounter for immunization: Secondary | ICD-10-CM

## 2013-08-10 DIAGNOSIS — L500 Allergic urticaria: Secondary | ICD-10-CM

## 2013-08-10 MED ORDER — CETIRIZINE HCL 10 MG PO TABS: 10.0000 mg | ORAL_TABLET | Freq: Every day | ORAL | Status: DC

## 2013-08-10 MED ORDER — PREDNISONE 20 MG PO TABS: 20.0000 mg | ORAL_TABLET | Freq: Every day | ORAL | Status: DC

## 2013-08-10 NOTE — Patient Instructions (Signed)
Fue un Research officer, trade union.  Para la alergia, le estoy recetando la PREDNISONE 20mg , una tableta por boca, cada manana con comida.  Tomesela por 5 dias, si necesita tomarla por mas tiempo, la puede tomar por hasta 10 dias seguidos.   Cetirizine 10mg , una tableta por boca una vez diario, antihistaminico para alergia.    Si no esta' respondiendo a la medicina, por favor llame.

## 2013-08-10 NOTE — Progress Notes (Signed)
  Subjective:    Patient ID: Rita Sanchez, female    DOB: 02/12/1971, 42 y.o.   MRN: [id number]  HPI Patient seen for SDA, visit conducted in [place].  Patient reports onset of intensely pruritic rash that erupted day before yesterday soon after eating Oreo cookies ("nieves"); began to have intense itch across trunk, back, arms and legs, face.  Has taken a single dose of benadryl but did not notice improvement.  Had some lip swelling before, which has since resolved.  No fevers or chills, no other foods that she can associate with the onset of the symptoms.  Other family members ate the cookies and did not have similar outbreak.   She has had itchy skin rashes before, which were milder and self-limited.   ROS: No changes in hygiene products, no detergent changes, no new pets. No cough or sputum production.    Review of Systems     Objective:   Physical Exam Well appearing, no apparent distress. HEENT Neck supple, no cervical adenopathy. No lip swelling, no oropharyngeal or palate lesions.  SKIN: Wheals across abdomen, back, chest, arms and legs, and face; sparing soles and palms.  Blanching.  Some excoriation.        Assessment & Plan:

## 2013-08-10 NOTE — Assessment & Plan Note (Signed)
Urticarial lesion, only identifiable source is Oreo cookies she ate just before breaking out.  No outdoor activity or tick exposure; not systemically ill-appearing.  No changes in hygiene or cleaning products.   To treat with burst systemic steroids; reassess in about 1 week.  May use nonsedating antihistamine as needed for milder episodes.  Discussed reasons for more prompt/urgent evaluation before scheduled appointment.

## 2013-12-25 ENCOUNTER — Ambulatory Visit: Payer: No Typology Code available for payment source

## 2014-08-19 ENCOUNTER — Encounter: Payer: Self-pay | Admitting: Family Medicine

## 2014-08-19 ENCOUNTER — Ambulatory Visit (INDEPENDENT_AMBULATORY_CARE_PROVIDER_SITE_OTHER): Payer: Self-pay | Admitting: Family Medicine

## 2014-08-19 VITALS — BP 133/77 | HR 66 | Temp 98.2°F | Ht 59.0 in | Wt 161.0 lb

## 2014-08-19 DIAGNOSIS — R300 Dysuria: Secondary | ICD-10-CM

## 2014-08-19 DIAGNOSIS — F32A Depression, unspecified: Secondary | ICD-10-CM

## 2014-08-19 DIAGNOSIS — F329 Major depressive disorder, single episode, unspecified: Secondary | ICD-10-CM

## 2014-08-19 DIAGNOSIS — Z113 Encounter for screening for infections with a predominantly sexual mode of transmission: Secondary | ICD-10-CM

## 2014-08-19 DIAGNOSIS — R5382 Chronic fatigue, unspecified: Secondary | ICD-10-CM

## 2014-08-19 LAB — POCT URINALYSIS DIPSTICK
BILIRUBIN UA: NEGATIVE
Glucose, UA: NEGATIVE
KETONES UA: NEGATIVE
Leukocytes, UA: NEGATIVE
Nitrite, UA: NEGATIVE
PH UA: 7.5
Protein, UA: NEGATIVE
RBC UA: NEGATIVE
Spec Grav, UA: 1.02
Urobilinogen, UA: 1

## 2014-08-19 MED ORDER — FLUOXETINE HCL 20 MG PO TABS: 20.0000 mg | ORAL_TABLET | Freq: Every day | ORAL | Status: DC

## 2014-08-19 NOTE — Progress Notes (Signed)
Patient ID: Rita Sanchez, female   DOB: 02/02/1971, 43 y.o.   MRN: [id number]  HPI:  Note: appt scheduled as health maintenance visit, but patient had multiple problems to discuss today so we elected to make this a problem-oriented visit. She will schedule a physical for another time.  Fatigue: endorse feeling of being very fatigued. Has hx of depression, not currently on any medications. She does snore and has never had a sleep study. Does nap during the day. Falls asleep well at night. Denies heavy periods, blood in stool, fevers, family hx of cancer. Endorses occasional SOB when she is very stressed, happens about 2 x a week. Also occ has sharp pain in chest. Also spinning dizzy sensation, worse with position changes, no problems with hearing. Feels sad. Denies any recent or current SI/HI. Also endorses pounding headache which is helped by tylenol.    Polyuria - gets up frequently at night to urinate. Endorses mild dysuria. No fevers.  Possible stroke - patient mentioned to me that she was told 6-7 years ago that she had a stroke. We have never talked about this before. States she has R facial droop from the stroke. 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  Pt also brought up at end of visit that she would like to be tested for STD's. Will schedule separate appt for pelvic exam.  ROS: See HPI.  PMFSH: hx depression  PHYSICAL EXAM: BP 133/77 mmHg  Pulse 66  Temp(Src) 98.2 F (36.8 C) (Oral)  Ht 4\' 11"  (1.499 m)  Wt 161 lb (73.029 kg)  BMI 32.50 kg/m2  LMP 07/24/2014 Gen: NAD HEENT: NCAT Heart: RRR, no murmurs Lungs: CTAB, NWOB Neuro: cranial nerves II-XII tested and intact, some mild R sided facial droop present (chronic per pt) and also mild decr sensation to light touch on R face. Speech normal. Full strength  bilat upper and lower ext. Normal FNF.  Ext: No appreciable lower extremity edema bilaterally  Psych: normal range of affect, well groomed, speech normal in rate and volume, normal eye contact   ASSESSMENT/PLAN:  Health maintenance:  -advised that pt schedule a separate visit to address health maintenance issues.   Mild dysuria & frequency: Check UA today to rule out UTI. Will also check fasting glucose to rule out diabetes.  Depression Suspect majority of pt's somatic complaints (fatigue, headache, dizziness) related to untreated depression. Pt agreeable to starting pharmacotherapy today. Will restart prozac 20mg  daily. Will also check CBC and CMET to rule out organic cause of fatigue. F/u in 3-4 weeks to evaluate for tolerability and efficacy of prozac. If persistent fatigue despite therapeutic dosing of prozac, consider sleep study in the future.   FOLLOW UP: F/u in 3-4 weeks for depression f/u and STD testing.  GrenadaBrittany J. Pollie MeyerMcIntyre, MD [facility]

## 2014-08-19 NOTE — Patient Instructions (Signed)
Start prozac 20mg  daily Follow up in 3 weeks Use tylenol prn headache On your way out, schedule an appointment one morning to come back for fasting labs. Do not eat or drink anything other than water the morning of your lab appointment until after your labs are drawn.

## 2014-08-20 ENCOUNTER — Other Ambulatory Visit: Payer: Self-pay

## 2014-08-25 DIAGNOSIS — Z8669 Personal history of other diseases of the nervous system and sense organs: Secondary | ICD-10-CM | POA: Insufficient documentation

## 2014-08-25 NOTE — Assessment & Plan Note (Addendum)
Suspect majority of pt's somatic complaints (fatigue, headache, dizziness) related to untreated depression. Pt agreeable to starting pharmacotherapy today. Will restart prozac 20mg  daily. Will also check CBC and CMET to rule out organic cause of fatigue. F/u in 3-4 weeks to evaluate for tolerability and efficacy of prozac. If persistent fatigue despite therapeutic dosing of prozac, consider sleep study in the future.

## 2014-09-11 ENCOUNTER — Ambulatory Visit: Payer: Self-pay | Admitting: Family Medicine

## 2014-11-06 ENCOUNTER — Ambulatory Visit (INDEPENDENT_AMBULATORY_CARE_PROVIDER_SITE_OTHER): Payer: Self-pay | Admitting: Family Medicine

## 2014-11-06 VITALS — BP 115/78 | HR 63 | Temp 97.0°F | Ht 59.0 in | Wt 164.3 lb

## 2014-11-06 DIAGNOSIS — M6248 Contracture of muscle, other site: Secondary | ICD-10-CM

## 2014-11-06 DIAGNOSIS — Z23 Encounter for immunization: Secondary | ICD-10-CM

## 2014-11-06 DIAGNOSIS — Z113 Encounter for screening for infections with a predominantly sexual mode of transmission: Secondary | ICD-10-CM

## 2014-11-06 DIAGNOSIS — M62838 Other muscle spasm: Secondary | ICD-10-CM

## 2014-11-06 DIAGNOSIS — R5382 Chronic fatigue, unspecified: Secondary | ICD-10-CM

## 2014-11-06 LAB — CBC
HCT: 40.7 % (ref 36.0–46.0)
Hemoglobin: 13.8 g/dL (ref 12.0–15.0)
MCH: 30.4 pg (ref 26.0–34.0)
MCHC: 33.9 g/dL (ref 30.0–36.0)
MCV: 89.6 fL (ref 78.0–100.0)
MPV: 9.3 fL (ref 8.6–12.4)
Platelets: 360 10*3/uL (ref 150–400)
RBC: 4.54 MIL/uL (ref 3.87–5.11)
RDW: 13.1 % (ref 11.5–15.5)
WBC: 8.8 10*3/uL (ref 4.0–10.5)

## 2014-11-06 LAB — COMPREHENSIVE METABOLIC PANEL
ALT: 18 U/L (ref 0–35)
AST: 23 U/L (ref 0–37)
Albumin: 4.2 g/dL (ref 3.5–5.2)
Alkaline Phosphatase: 42 U/L (ref 39–117)
BILIRUBIN TOTAL: 0.2 mg/dL (ref 0.2–1.2)
BUN: 7 mg/dL (ref 6–23)
CO2: 24 meq/L (ref 19–32)
Calcium: 9.4 mg/dL (ref 8.4–10.5)
Chloride: 103 mEq/L (ref 96–112)
Creat: 0.62 mg/dL (ref 0.50–1.10)
GLUCOSE: 90 mg/dL (ref 70–99)
Potassium: 3.8 mEq/L (ref 3.5–5.3)
SODIUM: 137 meq/L (ref 135–145)
Total Protein: 7.2 g/dL (ref 6.0–8.3)

## 2014-11-06 LAB — TSH: TSH: 4.403 u[IU]/mL (ref 0.350–4.500)

## 2014-11-06 MED ORDER — CYCLOBENZAPRINE HCL 10 MG PO TABS: 10.0000 mg | ORAL_TABLET | Freq: Three times a day (TID) | ORAL | Status: DC | PRN

## 2014-11-06 NOTE — Progress Notes (Signed)
    Subjective   Rita PurpuraMaria C Sanchez is a 44 y.o. female that presents for a same day visit  1. Neck pain: Symptoms started this morning. No recent injury. Pain is illicited when looking down and up. No fever, chills or sweats. She states she has headache which has remained unchanged. 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 Smoking status: Current Every Day Smoker    Types: Cigarettes  . Smokeless tobacco: Not on file     Comment: 2-3 cigs per day. Wants to quit. Will make appt for smoking cessation discussion.  . Alcohol Use: Yes     Comment: occasional alcohol use, less than once a month.    ROS Per HPI  Objective   BP 115/78 mmHg  Pulse 63  Temp(Src) 97 F (36.1 C) (Oral)  Ht 4\' 11"  (1.499 m)  Wt 164 lb 4.8 oz (74.526 kg)  BMI 33.17 kg/m2  LMP 10/08/2014  General: Well appearing female, no distress Musculoskeletal: Neck with full range of motion. Able to touch chin with no difficulty. Tenderness along upper trapezius mucsle with no appreciable spasm  Assessment and Plan   Muscle spasm of neck: no meningeal signs. Patient afebrile. Mental status intact  Flexeril 10mg  TID PRN #30  Tylenol PRN  Return precautions discussed  Handout given

## 2014-11-06 NOTE — Patient Instructions (Signed)
Thank you for coming to see me today. It was a pleasure. Today we talked about:   Neck pain: this pain appears related to a muscle spasm. I am prescribing Flexeril 10mg  three times per day as needed for neck pain. Please see how this affects you while you're at home because it can cause drowsiness. You can also place a hot towel or heating pad on your shoulders/neck for comfort. Tylenol as needed can help also. If you start to have fevers, chills, increased neck stiffness, please return immediately for evaluation.  If you have any questions or concerns, please do not hesitate to call the office at [phone].  Sincerely,  Jacquelin Hawkingalph Joani Cosma, MD    Calambres y espasmos musculares  (Muscle Cramps and Spasms)  Los calambres musculares y espasmos ocurren cuando un msculo o grupos de msculos se tensan y no se tiene control sobre esta tensin (contraccin muscular involuntaria). Es un problema comn y Software engineerpuede aparecer en cualquier msculo. La zona ms comn son los msculos de la pantorrilla. Tanto los Liberty Globalcalambres como los espasmos son contracciones musculares involuntarias, pero tambin tienen diferencias:   Los calambres musculares son espordicos y Engineer, miningdolorosos. Pueden durar entre algunos segundos hasta un cuarto de [place]hora. Los calambres musculares son ms fuertes y duran ms que los espasmos musculares.  Los espasmos pueden o no ser dolorosos. Pueden durar algunos segundos o mucho ms. CAUSAS  No es frecuente que los calambres se deban a un trastorno subyacente grave. En muchos casos, la causa de los calambres y los espasmos es desconocida. Algunas causas frecuentes son:   Esfuerzo excesivo.   El uso excesivo del msculo por movimientos repetitivos (hacer lo mismo una y Laverda Pageotra vez).   Permanecer en cierta posicin durante un largo perodo de Eldoradotiempo.   Preparacin, forma o tcnica inadecuada al realizar un deporte o [place] Cityactividad.   Deshidratacin.   Traumatismos.   Efectos secundarios de  algunos medicamentos.  Niveles anormalmente bajos de las sales e iones en la sangre (electrolitos), especialmente el potasio y el calcio. Pueden ocurrir cuando se toman pldoras para Geographical information systems officerorinar (diurticos) o en las mujeres embarazadas.  Algunos problemas mdicos subyacentes pueden hacer que sea ms propenso a desarrollar calambres o espasmos. Estos incluyen, pero no se limitan a:   Diabetes.   Enfermedad de Parkinson.   Trastornos hormonales, tales como problemas de la tiroides.   El consumo excesivo de alcohol.   Enfermedades especficas de Harrah's Entertainmentlos msculos, las articulaciones y [place] Fallslos huesos.   Enfermedad vascular en la que no llega suficiente sangre a los msculos.  INSTRUCCIONES PARA EL CUIDADO EN EL HOGAR   Mantngase bien hidratado. Beba gran cantidad de lquido para mantener la orina de tono claro o color amarillo plido.  Puede ser til Engineer, maintenance (IT)masajear, Therapist, musicelongar y International aid/development workerrelajar el msculo afectado.  Para los msculos tensos o apretados, use una toalla caliente, una almohadilla trmica o agua caliente de la ducha dirigida a la zona afectada.  Si est dolorido o siente dolor despus de un calambre o espasmo, aplique hielo en el rea afectada para Acupuncturistaliviar el malestar.  Ponga el hielo en una bolsa plstica.  Colquese una toalla entre la piel y la bolsa de hielo.  Deje el hielo en el lugar durante 15 a 20 minutos, 3 a 4 veces por da.  Los medicamentos que se utilizan para tratar las causas conocidas de los calambres o espasmos pueden reducir su frecuencia o gravedad. Tome slo medicamentos de venta libre o recetados, segn las indicaciones del mdico. AlcoaSOLICITE ATENCIN  MDICA SI:  Los calambres o espasmos empeoran, ocurren con ms frecuencia o no mejoran con el tiempo.  ASEGRESE DE QUE:   Comprende estas instrucciones.  Controlar su enfermedad.  Solicitar ayuda de inmediato si no mejora o si empeora. Document Released: 07/14/2005 Document Revised: 01/29/2013 [facility] Patient  Information 2015 [place], [place]. This information is not intended to replace advice given to you by your health care provider. Make sure you discuss any questions you have with your health care provider.

## 2014-11-06 NOTE — Progress Notes (Signed)
Patient was accompanied by Pearletha AlfredMaria Elena Jimenez from [employer] Interpreter service because of language barrier.Glennie HawkSimpson, Michelle R

## 2014-11-07 ENCOUNTER — Encounter: Payer: Self-pay | Admitting: Family Medicine

## 2014-11-07 LAB — HEPATITIS PANEL, ACUTE
HCV Ab: NEGATIVE
HEP A IGM: NONREACTIVE
HEP B C IGM: NONREACTIVE
Hepatitis B Surface Ag: NEGATIVE

## 2014-11-07 LAB — HIV ANTIBODY (ROUTINE TESTING W REFLEX): HIV 1&2 Ab, 4th Generation: NONREACTIVE

## 2014-11-07 LAB — RPR

## 2016-03-29 ENCOUNTER — Ambulatory Visit: Payer: Self-pay | Admitting: Family Medicine

## 2016-03-31 ENCOUNTER — Ambulatory Visit (HOSPITAL_COMMUNITY)
Admission: RE | Admit: 2016-03-31 | Discharge: 2016-03-31 | Disposition: A | Discharge status: Home / Self Care | Payer: BLUE CROSS/BLUE SHIELD | Source: Ambulatory Visit | Attending: Family Medicine | Admitting: Family Medicine

## 2016-03-31 ENCOUNTER — Encounter: Payer: Self-pay | Admitting: Family Medicine

## 2016-03-31 ENCOUNTER — Ambulatory Visit (INDEPENDENT_AMBULATORY_CARE_PROVIDER_SITE_OTHER): Payer: BLUE CROSS/BLUE SHIELD | Admitting: Family Medicine

## 2016-03-31 VITALS — BP 110/46 | HR 64 | Temp 97.8°F | Ht 59.0 in | Wt 153.0 lb

## 2016-03-31 DIAGNOSIS — E669 Obesity, unspecified: Secondary | ICD-10-CM

## 2016-03-31 DIAGNOSIS — R202 Paresthesia of skin: Secondary | ICD-10-CM

## 2016-03-31 DIAGNOSIS — Z13220 Encounter for screening for lipoid disorders: Secondary | ICD-10-CM | POA: Diagnosis not present

## 2016-03-31 DIAGNOSIS — R079 Chest pain, unspecified: Secondary | ICD-10-CM | POA: Insufficient documentation

## 2016-03-31 DIAGNOSIS — G5603 Carpal tunnel syndrome, bilateral upper limbs: Secondary | ICD-10-CM

## 2016-03-31 DIAGNOSIS — Z716 Tobacco abuse counseling: Secondary | ICD-10-CM

## 2016-03-31 LAB — POCT GLYCOSYLATED HEMOGLOBIN (HGB A1C): Hemoglobin A1C: 5.4

## 2016-03-31 MED ORDER — WRIST SPLINT/COCK-UP/RIGHT M MISC: Status: DC

## 2016-03-31 MED ORDER — WRIST SPLINT/COCK-UP/LEFT M MISC: Status: DC

## 2016-03-31 NOTE — Patient Instructions (Signed)
On your way out, schedule an appointment one morning to come back for fasting labs. Do not eat or drink anything other than water the morning of your lab appointment until after your labs are drawn.   I don't think the chest pain was coming from your heart. If you continue to have pain please return to be seen again.  See the handout on how to schedule your mammogram. This is an important test to screen for breast cancer.   For wrists, wear splints at night. Use ibuprofen 400mg  every 6 hours as needed. Follow up with me in 6 weeks for the carpal tunnel syndrome  Be well, Dr. Elzie RingsMcIntyre   Sndrome del tnel carpiano (Carpal Tunnel Syndrome) El sndrome del tnel carpiano es una afeccin que causa dolor en la mano y en el brazo. El tnel carpiano es un espacio estrecho ubicado en el lado palmar de la [place]. Los movimientos de la Turkmenistanmueca o ciertas enfermedades pueden causar hinchazn del tnel. Esta hinchazn comprime el nervio principal de la mueca (nervio mediano). CAUSAS  Esta afeccin puede ser causada por lo siguiente:   Movimientos repetidos de CHS Incla mueca.  Lesiones en la [place].  Artritis.  Un quiste o un tumor en el tnel carpiano.  Acumulacin de lquido Academic librariandurante el embarazo. A veces, se desconoce la causa de esta afeccin.  FACTORES DE RIESGO Es ms probable que esta afeccin se manifieste en:   Las personas que tienen trabajos en los que deben Humana Increalizar los mismos movimientos repetidos de las [place]og[place]uecas, como los carniceros y los cajeros.  Las mujeres.  Las personas que tienen determinadas enfermedades, por ejemplo:  Diabetes.  Obesidad.  Tiroides hipoactiva (hipotiroidismo).  Insuficiencia renal. SNTOMAS  Los sntomas de esta afeccin incluyen lo siguiente:   Sensacin de hormigueo en los dedos de la mano, Dentistespecialmente el pulgar, el ndice y el dedo Salemmedio.  Hormigueo o adormecimiento en la mano.  Sensacin de Engineer, miningdolor en todo el brazo, especialmente cuando la  [place]ca y el codo estn flexionados durante Leedsmucho tiempo.  Dolor en la mueca que sube por el brazo hasta el hombro.  Dolor que baja por la mano o los dedos.  Sensacin de Marathon Oildebilidad en las manos. Tal vez tenga dificultad para tomar y Personal assistantsostener objetos. Los sntomas pueden empeorar durante la noche.  DIAGNSTICO  Esta afeccin se diagnostica mediante la historia clnica y un examen fsico. Tambin pueden hacerle exmenes, que incluyen los siguientes:   Electromiografa (EMG). Esta prueba mide las seales elctricas que los nervios les envan a los msculos.  Radiografas. TRATAMIENTO  El tratamiento de esta afeccin incluye lo siguiente:  Cambios en el estilo de vida. Es importante dejar de Radio producerhacer o modificar la actividad que caus la afeccin.  Fisioterapia o terapia ocupacional.  Analgsicos y antiinflamatorios. Esto puede incluir medicamentos que se inyectan en la [place].  Una frula para la [place].  Ciruga. INSTRUCCIONES PARA EL CUIDADO EN EL HOGAR  Si tiene una frula:  sela como se lo haya indicado el mdico. Qutesela solamente como se lo haya indicado el mdico.  Afloje la frula si los dedos se le entumecen, siente hormigueos o se le enfran y se tornan de Research officer, trade unioncolor azul.  Mantenga la frula limpia y seca. Instrucciones generales  Baxter Internationalome los medicamentos de venta libre y los recetados solamente como se lo haya indicado el mdico.  Haga reposar la [place] de toda actividad que le cause dolor. Si la afeccin tiene relacin con Kathie Dikeel trabajo, hable con su empleador United Stationerssobre los  cambios que pueden [place], por ejemplo, usar una almohadilla para apoyar la [place] tipea.  Si se lo indican, aplique hielo sobre la zona dolorida:  Ponga el hielo en una bolsa plstica.  Coloque una toalla entre la piel y la bolsa de hielo.  Coloque el hielo durante , 2 a 3veces por Futures trader.  Concurra a todas las visitas de control como se lo haya indicado el mdico. Esto es  importante.  Haga los ejercicios como se lo hayan indicado el mdico, el fisioterapeuta o el terapeuta ocupacional. SOLICITE ATENCIN MDICA SI:   Aparecen nuevos sntomas.  El dolor no se alivia con los [employer].  Los sntomas empeoran.   Esta informacin no tiene Theme park manager el consejo del mdico. Asegrese de hacerle al mdico cualquier pregunta que tenga.   Document Released: 10/04/2005 Document Revised: 06/25/2015 Elsevier Interactive Patient Education [employer]

## 2016-03-31 NOTE — Progress Notes (Signed)
Date of Visit: 03/31/2016   HPI:  Patient insisted on friend being her interpreter today. Consent form signed.   Presents to discuss numbness in her hands. Present for 1 month. L worse than R. Especially worse at night. No history of this before. Tried tylenol, which helped a little.   Chest pain - 8 days ago had a sharp "pin-like" feeling in her chest. Lasted 5-10 minutes. 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 Kept working, did not stop her from completing her tasks. She did have some subjective shortness of breath when this happened. Also had cramp in her arm, otherwise did not radiate anywhere. It happened twice. Each time, lasted 5-10 minutes, and went away on its own without intervention. Smokes 3-4 cigarettes per day. No history of diabetes or hypertension. No family history of cardiac issues.  Has family history of diabetes and would like to be tested for this today.  ROS: See HPI.  PMFSH: history of depression, peptic ulcer, tobacco abuse  PHYSICAL EXAM: BP 110/46 mmHg  Pulse 64  Temp(Src) 97.8 F (36.6 C) (Oral)  Ht 4\' 11"  (1.499 m)  Wt 153 lb (69.4 kg)  BMI 30.89 kg/m2 Gen: NAD, pleasant, cooperative HEENT: normocephalic, atraumatic, mmm Heart:  Regular rate and rhythm, no murmur Lungs: clear to auscultation bilaterally, normal work of breathing  Neuro: alert, grossly nonfocal, speech normal Ext: bilateral hands without any visible abnormalities. Grip 5/5 bilaterally. 2+ radial pulses bilaterally. No thenar atrophy. +phalens. Negative tinels.  ASSESSMENT/PLAN:  Health maintenance:  -given mammogram handout  Carpal tunnel syndrome History & exam consistent with carpal tunnel syndrome -Nightly bilateral cockup splints -Ibuprofen 400mg  q6h as needed -follow up in 6 weeks  Obesity A1c normal today, no diabetes Return for fasting lipids & CMET Discussed importance of weight management  Tobacco abuse counseling Encouraged smoking  cessation.  Chest pain - not typical for angina. EKG nonischemic. Suspect musculoskeletal etiology. Will monitor for now, asked patient to follow up if chest pain recurs.   FOLLOW UP: Follow up in 6 weeks for carpal tunnel syndrome  GrenadaBrittany J. Pollie MeyerMcIntyre, MD [facility]

## 2016-04-02 ENCOUNTER — Other Ambulatory Visit: Payer: BLUE CROSS/BLUE SHIELD

## 2016-04-02 DIAGNOSIS — G5600 Carpal tunnel syndrome, unspecified upper limb: Secondary | ICD-10-CM | POA: Insufficient documentation

## 2016-04-02 DIAGNOSIS — E669 Obesity, unspecified: Secondary | ICD-10-CM | POA: Insufficient documentation

## 2016-04-02 DIAGNOSIS — Z13220 Encounter for screening for lipoid disorders: Secondary | ICD-10-CM

## 2016-04-02 LAB — COMPLETE METABOLIC PANEL WITH GFR
ALBUMIN: 4.3 g/dL (ref 3.6–5.1)
ALK PHOS: 38 U/L (ref 33–115)
ALT: 14 U/L (ref 6–29)
AST: 25 U/L (ref 10–35)
BILIRUBIN TOTAL: 0.4 mg/dL (ref 0.2–1.2)
BUN: 9 mg/dL (ref 7–25)
CALCIUM: 9 mg/dL (ref 8.6–10.2)
CO2: 23 mmol/L (ref 20–31)
Chloride: 106 mmol/L (ref 98–110)
Creat: 0.66 mg/dL (ref 0.50–1.10)
GLUCOSE: 94 mg/dL (ref 65–99)
Potassium: 4.2 mmol/L (ref 3.5–5.3)
Sodium: 137 mmol/L (ref 135–146)
TOTAL PROTEIN: 7.2 g/dL (ref 6.1–8.1)

## 2016-04-02 LAB — LIPID PANEL
Cholesterol: 220 mg/dL — ABNORMAL HIGH (ref 125–200)
HDL: 48 mg/dL (ref >=46)
LDL Cholesterol: 145 mg/dL — ABNORMAL HIGH (ref <130)
Total CHOL/HDL Ratio: 4.6 Ratio (ref <=5.0)
Triglycerides: 137 mg/dL (ref <150)
VLDL: 27 mg/dL (ref <30)

## 2016-04-02 NOTE — Assessment & Plan Note (Signed)
History & exam consistent with carpal tunnel syndrome -Nightly bilateral cockup splints -Ibuprofen 400mg  q6h as needed -follow up in 6 weeks

## 2016-04-02 NOTE — Assessment & Plan Note (Signed)
A1c normal today, no diabetes Return for fasting lipids & CMET Discussed importance of weight management

## 2016-04-02 NOTE — Assessment & Plan Note (Signed)
Encouraged smoking cessation 

## 2016-04-16 ENCOUNTER — Encounter: Payer: Self-pay | Admitting: Family Medicine

## 2016-04-22 ENCOUNTER — Ambulatory Visit (INDEPENDENT_AMBULATORY_CARE_PROVIDER_SITE_OTHER): Payer: BLUE CROSS/BLUE SHIELD | Admitting: Family Medicine

## 2016-04-22 ENCOUNTER — Encounter: Payer: Self-pay | Admitting: Family Medicine

## 2016-04-22 VITALS — BP 121/57 | HR 71 | Temp 98.2°F | Ht 59.0 in | Wt 153.0 lb

## 2016-04-22 DIAGNOSIS — G5603 Carpal tunnel syndrome, bilateral upper limbs: Secondary | ICD-10-CM

## 2016-04-22 DIAGNOSIS — R229 Localized swelling, mass and lump, unspecified: Secondary | ICD-10-CM | POA: Diagnosis not present

## 2016-04-22 DIAGNOSIS — M5441 Lumbago with sciatica, right side: Secondary | ICD-10-CM | POA: Diagnosis not present

## 2016-04-22 MED ORDER — CYCLOBENZAPRINE HCL 5 MG PO TABS: 5.0000 mg | ORAL_TABLET | Freq: Three times a day (TID) | ORAL | Status: DC | PRN

## 2016-04-22 MED ORDER — KETOROLAC TROMETHAMINE 30 MG/ML IJ SOLN
30.0000 mg | Freq: Once | INTRAMUSCULAR | Status: AC
  Administered 2016-04-22: 30 mg via INTRAMUSCULAR

## 2016-04-22 MED ORDER — IBUPROFEN 200 MG PO TABS: 400.0000 mg | ORAL_TABLET | Freq: Four times a day (QID) | ORAL | Status: DC | PRN

## 2016-04-22 NOTE — Progress Notes (Signed)
Date of Visit: 04/22/2016   HPI:  Patient presents to follow up on hand numbness. Spanish interpreter utilized during today's visit.   I saw her for this on 6/16 and recommended bilateral cockup splints & ibuprofen. She has been using the splints nightly on each hand. Has taken tylenol, but no ibuprofen. The R hand is better but L hand is no better. Especially feels numbness in thumb and 4th finger. She is R handed. 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  Back pain - also endorses R sided lower back pain for 3-4 months. Also has noted lump in her lower back for the same period of time. Has not seen a doctor for it. Denies having known fevers, saddle anesthesia, or problems with stooling or urination. Does endorse having some weakness in her legs, like at times she feels they are going to give out on her when she is walking. Denies having lumps or bumps elsewhere. The bump in her back is not getting bigger, staying same size. Back pain comes and goes.  ROS: See HPI.  PMFSH: history of carpal tunnel syndrome, depression, obesity  PHYSICAL EXAM: BP 121/57 mmHg  Pulse 71  Temp(Src) 98.2 F (36.8 C) (Oral)  Ht 4\' 11"  (1.499 m)  Wt 153 lb (69.4 kg)  BMI 30.89 kg/m2 Gen: NAD, pleasant, cooperative HEENT: normocephalic, atraumatic  Lungs: normal work of breathing  Neuro: grossly nonfocal, speech normal Ext: full strength bilateral lower extremities with hip abduction/adduction/flexion, knee flexion & extension, ankle dorsiflexion & plantarflexion. Sensation intact over bilateral lower extremities. Grip 5/5 bilaterally. 2+ radial pulses bilaterally. No thenar atrophy. Sensation intact over bilateral hands. +phalens, negative tinels bilaterally. Back: 2cm soft tissue nodule over R upper sacrum that is slightly mobile and mildly tender to palpation, seems imbedded within musculature  ASSESSMENT/PLAN:  Health maintenance:  -patient states  she will go get her mammogram  Carpal tunnel syndrome R hand improved, L hand without any improvement -toradol shot today for both hand pain & back pain -switch tylenol to ibuprofen - rx sent in, advised to wait 24 hours prior to starting ibuprofen -wear cockup splint on L hand as much as possible -follow up in 3-4 weeks -if no improvement then, consider corticosteroid injection in wrist vs referral to hand surgery -patient agreeable to this plan.  Back pain No red flags - full strength on exam Unclear what the lump/bump is - possibly soft tissue cyst vs injection granuloma? Will obtain soft tissue ultrasound of that area to evaluate further toradol shot today for pain relief, after 24 hours can take ibuprofen as needed Flexeril as well for muscle spasms Follow up with me in 3-4 weeks.   FOLLOW UP: Follow up in 3-4 weeks for above issues.  GrenadaBrittany J. Pollie MeyerMcIntyre, MD [facility]

## 2016-04-22 NOTE — Addendum Note (Signed)
Addended by: Garen GramsBENTON, Somaly Marteney F on: 04/22/2016 05:10 PM   Modules accepted: Orders

## 2016-04-22 NOTE — Assessment & Plan Note (Signed)
No red flags - full strength on exam Unclear what the lump/bump is - possibly soft tissue cyst vs injection granuloma? Will obtain soft tissue ultrasound of that area to evaluate further toradol shot today for pain relief, after 24 hours can take ibuprofen as needed Flexeril as well for muscle spasms Follow up with me in 3-4 weeks.

## 2016-04-22 NOTE — Assessment & Plan Note (Signed)
R hand improved, L hand without any improvement -toradol shot today for both hand pain & back pain -switch tylenol to ibuprofen - rx sent in, advised to wait 24 hours prior to starting ibuprofen -wear cockup splint on L hand as much as possible -follow up in 3-4 weeks -if no improvement then, consider corticosteroid injection in wrist vs referral to hand surgery -patient agreeable to this plan.

## 2016-04-22 NOTE — Patient Instructions (Addendum)
Getting ultrasound of your back  toradol (pain shot) today No ibuprofen for 24 hours  Then start taking 400mg  of ibuprofen every 6 hours as needed Wear wrist splint as often as you can  Follow up with me in 3-4 weeks, sooner if worsening  Be well, Dr. Elzie RingsMcIntyre   Sndrome del tnel carpiano (Carpal Tunnel Syndrome) El sndrome del tnel carpiano es una afeccin que causa dolor en la mano y en el brazo. El tnel carpiano es un espacio estrecho ubicado en el lado palmar de la [place]. Los movimientos de la Turkmenistanmueca o ciertas enfermedades pueden causar hinchazn del tnel. Esta hinchazn comprime el nervio principal de la mueca (nervio mediano). CAUSAS  Esta afeccin puede ser causada por lo siguiente:   Movimientos repetidos de CHS Incla mueca.  Lesiones en la [place].  Artritis.  Un quiste o un tumor en el tnel carpiano.  Acumulacin de lquido Academic librariandurante el embarazo. A veces, se desconoce la causa de esta afeccin.  FACTORES DE RIESGO Es ms probable que esta afeccin se manifieste en:   Las personas que tienen trabajos en los que deben Humana Increalizar los mismos movimientos repetidos de las Robins AFBmuecas, como los carniceros y los cajeros.  Las mujeres.  Las personas que tienen determinadas enfermedades, por ejemplo:  Diabetes.  Obesidad.  Tiroides hipoactiva (hipotiroidismo).  Insuficiencia renal. SNTOMAS  Los sntomas de esta afeccin incluyen lo siguiente:   Sensacin de hormigueo en los dedos de la mano, Dentistespecialmente el pulgar, el ndice y el dedo [place] Stationmedio.  Hormigueo o adormecimiento en la mano.  Sensacin de Engineer, miningdolor en todo el brazo, especialmente cuando la [place] y el codo estn flexionados durante [place]mucho tiempo.  Dolor en la mueca que sube por el brazo hasta el hombro.  Dolor que baja por la mano o los dedos.  Sensacin de Marathon Oildebilidad en las manos. Tal vez tenga dificultad para tomar y Personal assistantsostener objetos. Los sntomas pueden empeorar durante la noche.  DIAGNSTICO  Esta afeccin  se diagnostica mediante la historia clnica y un examen fsico. Tambin pueden hacerle exmenes, que incluyen los siguientes:   Electromiografa (EMG). Esta prueba mide las seales elctricas que los nervios les envan a los msculos.  Radiografas. TRATAMIENTO  El tratamiento de esta afeccin incluye lo siguiente:  Cambios en el estilo de vida. Es importante dejar de Radio producerhacer o modificar la actividad que caus la afeccin.  Fisioterapia o terapia ocupacional.  Analgsicos y antiinflamatorios. Esto puede incluir medicamentos que se inyectan en la [place].  Una frula para la [place]mueca.  Ciruga. INSTRUCCIONES PARA EL CUIDADO EN EL HOGAR  Si tiene una frula:  sela como se lo haya indicado el mdico. Qutesela solamente como se lo haya indicado el mdico.  Afloje la frula si los dedos se le entumecen, siente hormigueos o se le enfran y se tornan de Research officer, trade unioncolor azul.  Mantenga la frula limpia y seca. Instrucciones generales  Baxter Internationalome los medicamentos de venta libre y los recetados solamente como se lo haya indicado el mdico.  Haga reposar la [place]mueca de toda actividad que le cause dolor. Si la afeccin tiene relacin con Kathie Dikeel trabajo, hable con su empleador Tribune Companysobre los cambios que pueden Adairhacerse, por [place]jemplo, usar una almohadilla para apoyar la mueca mientras tipea.  Si se lo indican, aplique hielo sobre la zona dolorida:  Ponga el hielo en una bolsa plstica.  Coloque una toalla entre la piel y la bolsa de hielo.  Coloque el hielo durante 20minutos, 2 a 3veces por Futures traderda.  Concurra a todas las visitas  de control como se lo haya indicado el mdico. Esto es importante.  Haga los ejercicios como se lo hayan indicado el mdico, el fisioterapeuta o el terapeuta ocupacional. SOLICITE ATENCIN MDICA SI:   Aparecen nuevos sntomas.  El dolor no se alivia con los United Parcelmedicamentos.  Los sntomas empeoran.   Esta informacin no tiene Theme park managercomo fin reemplazar el consejo del mdico. Asegrese de hacerle al  mdico cualquier pregunta que tenga.   Document Released: 10/04/2005 Document Revised: 06/25/2015 Elsevier Interactive Patient Education [employer] Inc2016 Elsevier Inc.

## 2016-04-26 ENCOUNTER — Ambulatory Visit (HOSPITAL_COMMUNITY)
Admission: RE | Admit: 2016-04-26 | Discharge: 2016-04-26 | Disposition: A | Discharge status: Home / Self Care | Payer: BLUE CROSS/BLUE SHIELD | Source: Ambulatory Visit | Attending: Family Medicine | Admitting: Family Medicine

## 2016-04-26 ENCOUNTER — Encounter: Payer: Self-pay | Admitting: Family Medicine

## 2016-04-26 DIAGNOSIS — R222 Localized swelling, mass and lump, trunk: Secondary | ICD-10-CM | POA: Diagnosis not present

## 2016-04-26 DIAGNOSIS — R229 Localized swelling, mass and lump, unspecified: Secondary | ICD-10-CM

## 2016-06-28 ENCOUNTER — Encounter: Payer: Self-pay | Admitting: Family Medicine

## 2016-06-28 ENCOUNTER — Ambulatory Visit (INDEPENDENT_AMBULATORY_CARE_PROVIDER_SITE_OTHER): Payer: BLUE CROSS/BLUE SHIELD | Admitting: Family Medicine

## 2016-06-28 ENCOUNTER — Other Ambulatory Visit (HOSPITAL_COMMUNITY)
Admission: RE | Admit: 2016-06-28 | Discharge: 2016-06-28 | Disposition: A | Discharge status: Home / Self Care | Payer: BLUE CROSS/BLUE SHIELD | Source: Ambulatory Visit | Attending: Family Medicine | Admitting: Family Medicine

## 2016-06-28 VITALS — BP 97/67 | HR 70 | Temp 98.3°F | Ht 59.0 in | Wt 155.8 lb

## 2016-06-28 DIAGNOSIS — Z113 Encounter for screening for infections with a predominantly sexual mode of transmission: Secondary | ICD-10-CM

## 2016-06-28 DIAGNOSIS — M545 Low back pain, unspecified: Secondary | ICD-10-CM

## 2016-06-28 DIAGNOSIS — N760 Acute vaginitis: Secondary | ICD-10-CM | POA: Diagnosis present

## 2016-06-28 DIAGNOSIS — G5603 Carpal tunnel syndrome, bilateral upper limbs: Secondary | ICD-10-CM

## 2016-06-28 DIAGNOSIS — Z23 Encounter for immunization: Secondary | ICD-10-CM

## 2016-06-28 DIAGNOSIS — N889 Noninflammatory disorder of cervix uteri, unspecified: Secondary | ICD-10-CM

## 2016-06-28 DIAGNOSIS — R229 Localized swelling, mass and lump, unspecified: Secondary | ICD-10-CM | POA: Diagnosis not present

## 2016-06-28 LAB — HIV ANTIBODY (ROUTINE TESTING W REFLEX): HIV: NONREACTIVE

## 2016-06-28 NOTE — Patient Instructions (Addendum)
Getting MRI to look at nodule  Referring to hand surgeon  Checking for STD's  Flu shot today See the handout on how to schedule your mammogram. This is an important test to screen for breast cancer.   Schedule an appointment in women's health/colposcopy clinic for a [facility].

## 2016-06-28 NOTE — Progress Notes (Signed)
Date of Visit: 06/28/2016   HPI:  Patient presents for follow up. Spanish interpreter utilized during today's visit.   Hand/finger tingling - wearing cockup splints nightly. Still having some discomfort/tingling in her fingers. Left worse during the day, but both bother her at night.   Nodule of back - had ultrasound, wants to review results. Showed 7mm nodule. The area is somewhat tender. She would want MRI to further evaluate. No issues with bowel/bladder incontinence (though does get up to urinate several times at night). No weakness in legs, saddle anesthesia, or fevers.   Vulvar itching - has had for past few weeks. Wants STD testing. No discharge. Sexually active with one female partner in the last year. History of tubal ligation in the past.  ROS: See HPI.  PMFSH: history of carpal tunnel syndrome, obesity, depression  PHYSICAL EXAM: BP 97/67   Pulse 70   Temp 98.3 F (36.8 C) (Oral)   Ht 4\' 11"  (1.499 m)   Wt 155 lb 12.8 oz (70.7 kg)   LMP 06/06/2016   BMI 31.47 kg/m  Gen: NAD, pleasant, cooperative HEENT: normocephalic, atraumatic, moist mucous membranes  Lungs: normal work of breathing  Neuro: alert grossly nonfocal, speech normal Back: mobile small subcutaneous nodule within R lumbosacral area, mildly tender to palpation  Ext: No appreciable lower extremity edema bilaterally. Grip 5/5 bilaterally. 2+ radial pulses bilaterally. No thenar atrophy bilaterally. +tinels & phalens GU: normal appearing external genitalia without lesions. Vagina is moist with thin discharge. Cervix with abnormal flesh colored lesion at 10-11oclock position.  No cervical motion tenderness or tenderness on bimanual exam. No adnexal masses.   ASSESSMENT/PLAN:  Health maintenance:  -given another handout on mammogram (as patient states she lost the old one) -flu shot given today   Back pain Will schedule MRI to further characterize the nodule, since it is symptomatic.  Carpal tunnel  syndrome Still bothersome to patient despite conservative treatment. Discussed options of referral to hand surgery vs trial of corticosteroid injection here in our clinic. Patient prefers referral, will enter this.  Lesion of cervix Lesion on cervix at 11oclock position noted on today's speculum exam. Last pap normal. Will have patient return [facility] to further evaluate. Full STD testing today (gc/chl/trich, HIV, RPR). Await STD results regarding vulvar itching - normal vulva on exam & not suggestive of yeast.  FOLLOW UP: Follow up in several weeks with colposcopy clinic for colpo Referring to hand surgery  GrenadaBrittany J. Pollie MeyerMcIntyre, MD [facility]

## 2016-06-29 DIAGNOSIS — N889 Noninflammatory disorder of cervix uteri, unspecified: Secondary | ICD-10-CM | POA: Insufficient documentation

## 2016-06-29 LAB — RPR

## 2016-06-29 LAB — CERVICOVAGINAL ANCILLARY ONLY
CHLAMYDIA, DNA PROBE: NEGATIVE
NEISSERIA GONORRHEA: NEGATIVE
Trichomonas: NEGATIVE

## 2016-06-29 NOTE — Assessment & Plan Note (Signed)
Still bothersome to patient despite conservative treatment. Discussed options of referral to hand surgery vs trial of corticosteroid injection here in our clinic. Patient prefers referral, will enter this.

## 2016-06-29 NOTE — Assessment & Plan Note (Signed)
Will schedule MRI to further characterize the nodule, since it is symptomatic.

## 2016-06-29 NOTE — Assessment & Plan Note (Addendum)
Lesion on cervix at 11oclock position noted on today's speculum exam. Last pap normal. Will have patient [facility] further [facility]. Full STD testing today (gc/chl/trich, HIV, RPR). Await STD results regarding vulvar itching - normal vulva on exam & not suggestive of yeast.

## 2016-07-01 ENCOUNTER — Ambulatory Visit (HOSPITAL_COMMUNITY)
Admission: RE | Admit: 2016-07-01 | Discharge: 2016-07-01 | Disposition: A | Discharge status: Home / Self Care | Payer: BLUE CROSS/BLUE SHIELD | Source: Ambulatory Visit | Attending: Family Medicine | Admitting: Family Medicine

## 2016-07-01 DIAGNOSIS — R229 Localized swelling, mass and lump, unspecified: Secondary | ICD-10-CM | POA: Diagnosis present

## 2016-07-02 ENCOUNTER — Other Ambulatory Visit: Payer: Self-pay | Admitting: Family Medicine

## 2016-07-02 DIAGNOSIS — Z1231 Encounter for screening mammogram for malignant neoplasm of breast: Secondary | ICD-10-CM

## 2016-07-08 ENCOUNTER — Ambulatory Visit: Payer: BLUE CROSS/BLUE SHIELD

## 2016-07-09 ENCOUNTER — Telehealth: Payer: Self-pay | Admitting: Family Medicine

## 2016-07-09 NOTE — Telephone Encounter (Signed)
Attempted to reach patient to discuss results using phone interpreter No answer. Left voicemail via interpreter on cell phone asking her to return the call.  Latrelle DodrillBrittany J Luna Audia, MD

## 2016-07-13 ENCOUNTER — Ambulatory Visit: Payer: BLUE CROSS/BLUE SHIELD

## 2016-07-22 ENCOUNTER — Ambulatory Visit (INDEPENDENT_AMBULATORY_CARE_PROVIDER_SITE_OTHER): Payer: BLUE CROSS/BLUE SHIELD | Admitting: Family Medicine

## 2016-07-22 ENCOUNTER — Encounter: Payer: Self-pay | Admitting: Family Medicine

## 2016-07-22 VITALS — BP 123/65 | HR 68 | Temp 99.0°F | Ht 59.0 in | Wt 155.8 lb

## 2016-07-22 DIAGNOSIS — N888 Other specified noninflammatory disorders of cervix uteri: Secondary | ICD-10-CM | POA: Diagnosis not present

## 2016-07-22 DIAGNOSIS — N889 Noninflammatory disorder of cervix uteri, unspecified: Secondary | ICD-10-CM | POA: Diagnosis not present

## 2016-07-22 MED ORDER — ACETAMINOPHEN 500 MG PO TABS
500.0000 mg | ORAL_TABLET | Freq: Once | ORAL | Status: AC
  Administered 2016-07-22: 500 mg via ORAL

## 2016-07-22 NOTE — Addendum Note (Signed)
Addended by: Clovis PuMARTIN, TAMIKA L on: 07/22/2016 10:54 AM   Modules accepted: Orders

## 2016-07-22 NOTE — Patient Instructions (Signed)
Biopsia cervicouterina, cuidados posteriores (Cervical Biopsy, Care After) Siga estas instrucciones durante las prximas semanas. Estas indicaciones le proporcionan informacin acerca de cmo deber cuidarse despus del procedimiento. El mdico tambin podr darle instrucciones ms especficas. El tratamiento ha sido planificado segn las prcticas mdicas actuales, pero en algunos casos pueden ocurrir problemas. Comunquese con el mdico si tiene algn problema o dudas despus del procedimiento. QU ESPERAR DESPUS DEL PROCEDIMIENTO Despus del procedimiento, es comn tener los siguientes sntomas:   Calambres abdominales o dolor leve durante algunos das.  Hemorragia vaginal leve durante algunos das.  Secrecin vaginal oscura durante algunos das. INSTRUCCIONES PARA EL CUIDADO EN EL HOGAR  Tome los medicamentos de venta libre y los recetados solamente como se lo haya indicado el mdico.  Reanude sus actividades normales como se lo haya indicado el mdico. Pregntele al mdico qu actividades son seguras para usted.  Use una compresa higinica hasta que la hemorragia y la secrecin se detengan.  No use tampones hasta que el mdico la autorice.  No se haga duchas vaginales hasta que el mdico la autorice.  No tenga relaciones sexuales hasta que el mdico la autorice.  Concurra a todas las visitas de control como se lo haya indicado el mdico. Esto es importante. SOLICITE ATENCIN MDICA SI:   Tiene fiebre o siente escalofros.  Tiene secrecin vaginal con mal olor.  Tiene picazn o irritacin alrededor de la vagina.  Siente dolor en la parte inferior del abdomen. SOLICITE ATENCIN MDICA DE INMEDIATO SI:   Tiene hemorragia vaginal abundante que empapa ms de una compresa higinica por hora.  Se desmaya.  Siente un dolor muy intenso en la parte inferior del abdomen.   Esta informacin no tiene como fin reemplazar el consejo del mdico. Asegrese de hacerle al mdico  cualquier pregunta que tenga.   Document Released: 06/25/2015 Elsevier Interactive Patient Education 2016 Elsevier Inc.   

## 2016-07-22 NOTE — Progress Notes (Addendum)
Subjective:     Rita Sanchez is a 45 y.o. female here for a routine exam.  Current complaints: None.  Personal health questionnaire reviewed: no.   Gynecologic History LMP: 07/07/16 Contraception: tubal ligation Last Pap: 2014. Results were: normal Last mammogram: 2014. Results were: Screening mammogram in one year. (Code:SM-B-01Y)  BI-RADS CATEGORY 2:  Benign finding(s). She plan on schedule.  Obstetric History OB History  No data available     The following portions of the patient's history were reviewed and updated as appropriate: allergies, current medications, past family history, past medical history, past social history, past surgical history and problem list.  Review of Systems Pertinent items are noted in HPI.    Objective:    Pelvic: external genitalia normal, uterus normal size, shape, and consistency, vagina normal without discharge and Cervix looks shiny without inflammation. Presence of small scattered cysts at 10 and 11 o clock, mildly vascularized. Solitary cyst at 6 O'clock.      Assessment:     Cervical Lesion Nabothian Cyst  Plan:    Education reviewed: Schedule mammogram. The lesion of cervix looks classically like nabothian cyst which is benign. Patient concern this might still be bad and she requested biopsy.    Her cervix was pretty soft hence very small specimen was obtainable. Specimen sent to lab for pathology. I will contact her with result. Return precaution discussed. F/U with PCP for other health concern.

## 2016-07-27 ENCOUNTER — Telehealth: Payer: Self-pay | Admitting: Family Medicine

## 2016-07-27 NOTE — Telephone Encounter (Signed)
Messages left for her on her home line and cell phone. Normal biopsy report.  Interpreter ID#: [id number].

## 2016-09-15 ENCOUNTER — Encounter: Payer: Self-pay | Admitting: Family Medicine

## 2016-09-15 NOTE — Telephone Encounter (Signed)
Patient never called back Will send letter Latrelle DodrillBrittany J Saree Krogh, MD

## 2016-12-30 ENCOUNTER — Encounter: Payer: Self-pay | Admitting: Family Medicine

## 2016-12-30 ENCOUNTER — Other Ambulatory Visit (HOSPITAL_COMMUNITY)
Admission: RE | Admit: 2016-12-30 | Discharge: 2016-12-30 | Disposition: A | Discharge status: Home / Self Care | Payer: BLUE CROSS/BLUE SHIELD | Source: Ambulatory Visit | Attending: Family Medicine | Admitting: Family Medicine

## 2016-12-30 ENCOUNTER — Ambulatory Visit (INDEPENDENT_AMBULATORY_CARE_PROVIDER_SITE_OTHER): Payer: BLUE CROSS/BLUE SHIELD | Admitting: Family Medicine

## 2016-12-30 VITALS — BP 104/68 | HR 55 | Temp 98.1°F | Ht 59.0 in | Wt 154.6 lb

## 2016-12-30 DIAGNOSIS — Z113 Encounter for screening for infections with a predominantly sexual mode of transmission: Secondary | ICD-10-CM | POA: Diagnosis not present

## 2016-12-30 DIAGNOSIS — N730 Acute parametritis and pelvic cellulitis: Secondary | ICD-10-CM

## 2016-12-30 DIAGNOSIS — R300 Dysuria: Secondary | ICD-10-CM | POA: Diagnosis not present

## 2016-12-30 LAB — POCT UA - MICROSCOPIC ONLY

## 2016-12-30 LAB — POCT URINALYSIS DIP (MANUAL ENTRY)
BILIRUBIN UA: NEGATIVE
Bilirubin, UA: NEGATIVE
Glucose, UA: NEGATIVE
LEUKOCYTES UA: NEGATIVE
NITRITE UA: NEGATIVE
PROTEIN UA: NEGATIVE
UROBILINOGEN UA: 0.2
pH, UA: 5.5

## 2016-12-30 LAB — POCT WET PREP (WET MOUNT)
Clue Cells Wet Prep Whiff POC: NEGATIVE
Trichomonas Wet Prep HPF POC: ABSENT

## 2016-12-30 MED ORDER — DOXYCYCLINE HYCLATE 100 MG PO TABS: 100.0000 mg | ORAL_TABLET | Freq: Two times a day (BID) | ORAL | 0 refills | Status: DC

## 2016-12-30 MED ORDER — CEFTRIAXONE SODIUM 250 MG IJ SOLR
250.0000 mg | Freq: Once | INTRAMUSCULAR | Status: AC
  Administered 2016-12-30: 250 mg via INTRAMUSCULAR

## 2016-12-30 NOTE — Patient Instructions (Signed)
Thank you so much for coming to visit today! Your urine was normal. Given your tenderness on pelvic exam, I think it is best if we treat for a pelvic infection (Pelvic Inflammatory Disease). We will give you a dose of antibiotics here and then send a prescription to the pharmacy for Doxycycline twice daily for 14 days. Please return in 2 weeks or sooner if your symptoms worsen. I will contact you about your lab results.  Dr. Venia Carbonumley   Enfermedad plvica inflamatoria (Pelvic Inflammatory Disease) El trmino enfermedad plvica inflamatoria (EPI) hace referencia a una infeccin en algunos rganos sexuales femeninos o en todos ellos. La infeccin se puede producir en el tero, los ovarios, las trompas de Falopio o los tejidos circundantes de la pelvis. La enfermedad plvica inflamatoria puede causar dolor en la pelvis o en el abdomen que aparece de manera repentina (dolor plvico agudo). La EPI es una infeccin grave porque puede derivar en dolor plvico prolongado (crnico) o en la imposibilidad de Warehouse managertener hijos (esterilidad). CAUSAS La causa ms frecuente de esta enfermedad es una infeccin que se disemina durante el contacto sexual. Sin embargo, la infeccin tambin puede deberse a las bacterias normales que se encuentran en los tejidos vaginales, si estas ascienden Lubrizol Corporationhasta los rganos reproductivos. La EPI tambin puede presentarse despus de lo siguiente:  El nacimiento de un beb.  Un aborto espontneo.  Un aborto.  Ciruga plvica mayor.  El uso de un dispositivo intrauterino (DIU).  Neomia DearUna agresin sexual. FACTORES DE RIESGO Esta afeccin es ms probable en las mujeres que tienen estas caractersticas:  Son menores de [place]25aos.  Son Weston Brassactivas sexualmente a una edad temprana.  Usan mtodos anticonceptivos que no son de barrera.  Tienen muchos compaeros sexuales.  Tienen relaciones sexuales con una persona que presenta sntomas de una ETS (enfermedad de transmisin sexual).  Toman  anticonceptivos. En ocasiones, determinadas conductas tambin pueden incrementar la posibilidad de Reklawtener EPI, por ejemplo:  Usar duchas vaginales.  Tener un DIU colocado. SNTOMAS Los sntomas de esta afeccin incluyen lo siguiente:  Dolor plvico o abdominal.  Grant RutsFiebre.  Escalofros.  Flujo vaginal anormal.  Sangrado uterino anormal.  Dolor atpico poco despus de la finalizacin de la Compomenstruacin.  Dolor al Beatrix Shipperorinar.  Dolor durante las The St. Paul Travelersrelaciones sexuales.  Nuseas y vmitos. DIAGNSTICO Para diagnosticar esta afeccin, el mdico le har un examen fsico y Neomia Dearuna historia clnica. Generalmente, el examen plvico revela un gran dolor con la palpacin del tero y de los tejidos plvicos circundantes. Tambin pueden hacerle estudios, por ejemplo:  Anlisis de laboratorio, TEPPCO Partnersentre ellos, Burk[place] Fasouna prueba de Sterlingtonembarazo, Jacksonhavenanlisis de Pensacolasangre y de Comorosorina.  Pruebas de cultivo de la vagina y del cuello del tero, a fin de Engineer, manufacturingdetectar la presencia de una ETS.  Ecografa.  Un procedimiento laparoscpico para examinar el interior de la pelvis.  Examen de las secreciones vaginales con un microscopio. TRATAMIENTO El tratamiento de esta afeccin puede tener uno o ms abordajes.  Se pueden recetar antibiticos por va oral.  Es posible que haya que tratar a los compaeros sexuales si el factor causante de la infeccin es una ETS.  Cuando los casos revisten mayor gravedad, es posible que haya que hospitalizar a la paciente para administrarle los antibiticos directamente en una vena a travs de una va intravenosa (IV).  Puede ser necesario hacer una ciruga si otros tratamientos no son eficaces, aunque esto es poco frecuente. Pueden pasar semanas hasta que se haya recuperado por completo. Si le diagnostican EPI, tambin deben Constellation Energyhacerle estudios de  deteccin del virus de inmunodeficiencia humana (VIH). El mdico puede repetirle las pruebas de deteccin de infecciones despus del tratamiento. No debe  mantener relaciones sexuales sin proteccin. INSTRUCCIONES PARA EL CUIDADO EN EL HOGAR  Tome los medicamentos de venta libre y los recetados solamente como se lo haya indicado el mdico.  Si le recetaron un antibitico, tmelo como se lo haya indicado el mdico. No deje de tomar los antibiticos aunque comience a Actor.  No tenga relaciones sexuales hasta completar el tratamiento, o como se lo haya indicado el mdico. Si se confirma la presencia de EPI, sus compaeros sexuales recientes debern recibir tratamiento, especialmente si las relaciones sexuales fueron sin proteccin.  Concurra a todas las visitas de control como se lo haya indicado el mdico. Esto es importante. SOLICITE ATENCIN MDICA SI:  Aumenta el flujo vaginal o este no es normal.  El dolor no mejora.  Vomita.  Tiene fiebre.  No puede tolerar los medicamentos.  Su pareja tiene una ETS.  Siente dolor al ConocoPhillips. SOLICITE ATENCIN MDICA DE INMEDIATO SI:  Aumenta el dolor abdominal o plvico.  Tiene escalofros.  Los sntomas no mejoran despus de 72horas, incluso con Scientist, research (medical). Esta informacin no tiene Theme park manager el consejo del mdico. Asegrese de hacerle al mdico cualquier pregunta que tenga. Document Released: 07/14/2005 Document Revised: 06/25/2015 Document Reviewed: 11/11/2014 Elsevier Interactive Patient Education  2017 [employer].

## 2016-12-31 NOTE — Progress Notes (Signed)
Subjective:     Patient ID: Rita Sanchez, female   DOB: 10/29/1970, 46 y.o.   MRN: [id number]  HPI Rita Sanchez is a 46yo female presenting today for pelvic discomfort. Visit conducted with aid of Spanish interpretor. Notes a burning sensation with wiping and urinating for the last 5-6 days. Hurts to sit a certain way and often has to move while seated. Subjective fever at night with no objective measurement. Notes nausea, but denies vomiting. Denies flank pain. Denies change in urinary frequency, but does not increased urgency. Denies genital ulcers or sores. Denies vaginal discharge. Does note some vaginal itching. History of negative screen for Gonorrhea, Chlamydia, RPR, Trichomonas, and HIV. Notes she is sexually active with a female who "likes to sleep around" about 3-4 times a month and does not use protection. Unsure if sexual intercourse is painful since she has not had intercourse since symptom onset.  Smoker.  Review of Systems Per HPI    Objective:   Physical Exam  Constitutional: She appears well-developed and well-nourished. No distress.  Cardiovascular: Normal rate and regular rhythm.   No murmur heard. Pulmonary/Chest: Effort normal. No respiratory distress. She has no wheezes.  Abdominal: Soft. She exhibits no distension.  Suprapubic tenderness. No CVA tenderness noted.  Genitourinary: No vaginal discharge found.  Genitourinary Comments: Cervical Motion Tenderness. Left Adnexal Tenderness. No genital ulcers noted.  Skin: No rash noted.  Psychiatric: She has a normal mood and affect. Her behavior is normal.      Assessment and Plan:     1. Pelvic Inflammatory Disease Given exam with cervical motion tenderness and left adnexal tenderness, will treat as PID. Urinalysis with trace blood, otherwise normal. Wet Prep with few bacteria. Gonorrhea and Chlamydia pending. Ceftriaxone 250mg  IM given and started 2 week course of Doxycycline 100mg  twice daily. To return in  2 weeks to monitor for improvement. Will need repeat in urinalysis to monitor for resolution of hematuria.

## 2017-01-01 LAB — CERVICOVAGINAL ANCILLARY ONLY
Chlamydia: NEGATIVE
Neisseria Gonorrhea: NEGATIVE

## 2017-01-18 ENCOUNTER — Ambulatory Visit (INDEPENDENT_AMBULATORY_CARE_PROVIDER_SITE_OTHER): Payer: BLUE CROSS/BLUE SHIELD | Admitting: Internal Medicine

## 2017-01-18 ENCOUNTER — Encounter: Payer: Self-pay | Admitting: Internal Medicine

## 2017-01-18 VITALS — BP 89/68 | HR 64 | Temp 98.0°F | Ht 59.0 in | Wt 154.4 lb

## 2017-01-18 DIAGNOSIS — R300 Dysuria: Secondary | ICD-10-CM | POA: Diagnosis not present

## 2017-01-18 DIAGNOSIS — R109 Unspecified abdominal pain: Secondary | ICD-10-CM | POA: Insufficient documentation

## 2017-01-18 DIAGNOSIS — R1030 Lower abdominal pain, unspecified: Secondary | ICD-10-CM | POA: Diagnosis not present

## 2017-01-18 DIAGNOSIS — G4700 Insomnia, unspecified: Secondary | ICD-10-CM | POA: Diagnosis not present

## 2017-01-18 LAB — POCT URINALYSIS DIP (MANUAL ENTRY)
BILIRUBIN UA: NEGATIVE
BILIRUBIN UA: NEGATIVE
Blood, UA: NEGATIVE
GLUCOSE UA: NEGATIVE
Leukocytes, UA: NEGATIVE
Nitrite, UA: NEGATIVE
Protein Ur, POC: NEGATIVE
Urobilinogen, UA: 0.2 (ref ?–2.0)
pH, UA: 6 (ref 5.0–8.0)

## 2017-01-18 MED ORDER — MELATONIN 5 MG PO TABS: 5.0000 mg | ORAL_TABLET | Freq: Every day | ORAL | 3 refills | Status: DC

## 2017-01-18 NOTE — Assessment & Plan Note (Signed)
-   Chronic.  - Reviewed good sleep hygiene - Recommended turning off TV sooner before bed - Recommended trying melatonin 5 mg an hour before bed - Reviewed progressive relaxation techniques

## 2017-01-18 NOTE — Progress Notes (Signed)
[facility] Progress Note  Subjective:  Rita Sanchez is a 46 y.o. female with history of arthritis and depression who presents for follow-up of abdominal pain and hematuria. Also reports ongoing insomnia. Visit assisted by Spanish video interpreter Lafonda Mosses (ID [phone]).  #Abdominal pain: - Resolved after receiving antibiotics at last OV 12/30/16. Was given IM ceftriaxone and two week course of doxycycline for possible PID, given cervical motion tenderness on exam with left adnexal tenderness. Lab results however came back negative for gonorrhea, chlamydia, trichomonas. - Had trace intact blood on UA at last visit - Denies dysuria or change in vaginal discharge - Still having unprotected sex with partner of 15 years despite her concerns that he may be sexually active with other women. He will not use condoms due to preference. Patient does report feeling safe in her relationship. - Had surgery in [place] for sterilization but does not recall name of operation - No nausea, vomiting, or diarrhea  #Insomnia: - Ongoing for last 3 years - Has not tried anything for it - Turns TV off at 8 PM and falls asleep until 10 or 11 PM and subsequently rolls around until about 3 AM has to wake up at 5 AM - Sleeps with grandchild in bed - Denies trouble with mood and denies new stressors - Thinks she may be going through menopause and has hot flashes but does not think this keeps her awake - Does not have racing thoughts when trying to fall asleep does get frustrated as she knows she has to get up for work early - Drinks only one cup of coffee in the morning and has no other caffeine ROS: No headaches, no mood swings  No Known Allergies  Objective: Blood pressure (!) 89/68, pulse 64, temperature 98 F (36.7 C), temperature source Oral, height  (1.499 m), weight 154 lb 6.4 oz (70 kg), last menstrual period 01/14/2017, SpO2 98 %. Body mass index is 31.19 kg/m. Constitutional:   obese female in no apparent distress  Cardiovascular: RRR, S1, S2, no m/r/g.  Abdominal: Soft. +BS, NT, ND Neurological: AOx3, no focal deficits. Psychiatric: Normal mood and affect.  Vitals reviewed  Assessment/Plan: Abdominal pain - Resolved.  - No hematuria on repeat UA today  Insomnia - Chronic.  - Reviewed good sleep hygiene - Recommended turning off TV sooner before bed - Recommended trying melatonin 5 mg an hour before bed - Reviewed progressive relaxation techniques  Follow-up in about 1 month if melatonin not helping.  Dani Gobble, MD [facility], PGY-2

## 2017-01-18 NOTE — Assessment & Plan Note (Signed)
-   Resolved.  - No hematuria on repeat UA today

## 2017-01-18 NOTE — Patient Instructions (Signed)
Tonette Bihari,  Un placer conocerte hoy. Me alegra que te sientas mejor. Su orina se ve clara y no presenta signos de infeccin. Todas sus pruebas de infecciones de transmisin sexual fueron negativas.  Intent con melatonina 5 mg una hora antes de acostarme para el insomnio. Tambin puede ayudar a hacer algo relajante como tomar un bao. Recomiendo apagar la televisin al menos 30 minutos antes de acostarse porque la luz puede ser estimulante.  Regrese en aproximadamente 1 mes si el sueo no es mejor.  Mejor, Dr. Sampson Goon  Ms. Rosales-Enciso,  Nice to meet you today. I am glad you are feeling better. Your urine looks clear and has no signs of infection. All of your testing for sexually transmitted infections were negative.  I would try melatonin 5 mg an hour before bed for insomnia. It can also help to do something relaxing like take a bath. I recommend turning off the television at least 30 minutes before bedtime because the light can be stimulating.   Come back in about 1 month if sleep is not better.   Best, Dr. Sampson Goon

## 2017-02-22 ENCOUNTER — Encounter: Payer: Self-pay | Admitting: Family Medicine

## 2017-02-22 ENCOUNTER — Ambulatory Visit (INDEPENDENT_AMBULATORY_CARE_PROVIDER_SITE_OTHER): Payer: BLUE CROSS/BLUE SHIELD | Admitting: Family Medicine

## 2017-02-22 VITALS — BP 110/70 | HR 71 | Temp 98.3°F | Ht 59.0 in | Wt 153.2 lb

## 2017-02-22 DIAGNOSIS — G5603 Carpal tunnel syndrome, bilateral upper limbs: Secondary | ICD-10-CM

## 2017-02-22 NOTE — Patient Instructions (Signed)
Sndrome del tnel carpiano (Carpal Tunnel Syndrome) El sndrome del tnel carpiano es una afeccin que causa dolor en la mano y en el brazo. El tnel carpiano es un espacio estrecho ubicado en el lado palmar de la [place]uh[place]ueca. Los movimientos repetidos de la mueca o determinadas enfermedades pueden causar la hinchazn del tnel. Esta hinchazn puede comprimir el nervio principal de la mueca (nervio mediano). CUIDADOS EN EL HOGAR Si tiene una frula:  sela como se lo haya indicado el mdico. Qutesela solamente como se lo haya indicado el mdico.  Afloje la frula si los dedos:  Se le adormecen y siente hormigueos.  Se le ponen azulados y fros.  Mantenga la frula limpia y seca. Instrucciones generales  Baxter Internationalome los medicamentos de venta libre y los recetados solamente como se lo haya indicado el mdico.  Haga reposar la Doughertymueca de toda actividad que le cause dolor. Si es necesario, hable con su empleador United Stationerssobre los cambios que pueden hacerse en su lugar de Biscaytrabajo, por ejemplo, usar una almohadilla para apoyar la mueca mientras tipea.  Si se lo indican, aplique hielo sobre la zona dolorida:  Ponga el hielo en una bolsa plstica.  Coloque una toalla entre la piel y la bolsa de hielo.  Coloque el hielo durante 20minutos, 2 a 3veces por Futures traderda.  Concurra a todas las visitas de control como se lo haya indicado el mdico. Esto es importante.  Haga los ejercicios como se lo hayan indicado el mdico, el fisioterapeuta o el terapeuta ocupacional. SOLICITE AYUDA SI:  Aparecen nuevos sntomas.  Los medicamentos no Tourist information centre managerle alivian el dolor.  Los sntomas empeoran. Esta informacin no tiene Theme park managercomo fin reemplazar el consejo del mdico. Asegrese de hacerle al mdico cualquier pregunta que tenga. Document Released: 09/23/2011 Document Revised: 06/25/2015 Document Reviewed: 02/19/2015 Elsevier Interactive Patient Education  2017 [employer]vin[employer].

## 2017-02-22 NOTE — Progress Notes (Signed)
   Subjective:   Rita Sanchez is a 46 y.o. female with a history of Depression, PUD, headaches, tobacco abuse here for same day appointment for  Chief Complaint  Patient presents with  . Dizziness    x3 days  . Hand Pain    numbness x 3 days   History obtained with help of Str[employer] video interpreter Fiorella [phone]  She reports bilateral wrist and hand pain for over one year. It is now worse on the right than the left. Her hands hurt so much in the mornings that she cannot move her fingers well. She states the pain started just in her fingertips and has now moved into her arm. She has some numbness in her hands in the morning. She was wearing gloves and cockup wrist splints at night to sleep. She does not think these are helpful. She wakes up in the middle the night sometimes with pain. She has not been to work at all this week due to hand pain. She was referred to hand surgery in 06/2016 by PCP but states she did not attend this appointment. She denies weakness, neck pain, blurred vision, chest pain, shortness of breath.  Review of Systems:  Per HPI.   Social History: Current smoker  Objective:  BP 110/70 (BP Location: Left Arm, Patient Position: Sitting, Cuff Size: Normal)   Pulse 71   Temp 98.3 F (36.8 C) (Oral)   Ht 4\' 11"  (1.499 m)   Wt 153 lb 3.2 oz (69.5 kg)   LMP  (LMP Unknown)   SpO2 98%   BMI 30.94 kg/m   Gen:  46 y.o. female in NAD  HEENT: NCAT, MMM, anicteric sclerae CV: RRR, no MRG Resp: Non-labored, CTAB, no wheezes noted Ext: WWP, no edema MSK: wrists: +Tinels and PHalens, ROM intact, strength and sensation itnact Neuro: Alert and oriented, speech normal       Chemistry      Component Value Date/Time   NA 137 04/02/2016 0832   K 4.2 04/02/2016 0832   CL 106 04/02/2016 0832   CO2 23 04/02/2016 0832   BUN 9 04/02/2016 0832   CREATININE 0.66 04/02/2016 0832      Component Value Date/Time   CALCIUM 9.0 04/02/2016 0832   ALKPHOS 38 04/02/2016  0832   AST 25 04/02/2016 0832   ALT 14 04/02/2016 0832   BILITOT 0.4 04/02/2016 0832      Lab Results  Component Value Date   WBC 8.8 11/06/2014   HGB 13.8 11/06/2014   HCT 40.7 11/06/2014   MCV 89.6 11/06/2014   PLT 360 11/06/2014   Lab Results  Component Value Date   TSH 4.403 11/06/2014   Lab Results  Component Value Date   HGBA1C 5.4 03/31/2016   Assessment & Plan:     Rita Sanchez is a 46 y.o. female here for   Carpal tunnel syndrome Remains symptomatic despite conservative treatment Replaced referral to hand surgery    Dawan Farney, Marzella SchleinAngela M, MD MPH PGY-3,  [facility] 02/23/2017  11:36 AM

## 2017-02-23 NOTE — Assessment & Plan Note (Signed)
Remains symptomatic despite conservative treatment Replaced referral to hand surgery

## 2017-04-01 ENCOUNTER — Other Ambulatory Visit: Payer: Self-pay | Admitting: Family Medicine

## 2017-04-28 ENCOUNTER — Ambulatory Visit (INDEPENDENT_AMBULATORY_CARE_PROVIDER_SITE_OTHER): Payer: BLUE CROSS/BLUE SHIELD | Admitting: Family Medicine

## 2017-04-28 ENCOUNTER — Encounter: Payer: Self-pay | Admitting: Licensed Clinical Social Worker

## 2017-04-28 ENCOUNTER — Encounter: Payer: Self-pay | Admitting: Family Medicine

## 2017-04-28 DIAGNOSIS — F32A Depression, unspecified: Secondary | ICD-10-CM

## 2017-04-28 DIAGNOSIS — F329 Major depressive disorder, single episode, unspecified: Secondary | ICD-10-CM | POA: Diagnosis not present

## 2017-04-28 DIAGNOSIS — G5603 Carpal tunnel syndrome, bilateral upper limbs: Secondary | ICD-10-CM | POA: Diagnosis not present

## 2017-04-28 MED ORDER — FLUOXETINE HCL 40 MG PO CAPS: 40.0000 mg | ORAL_CAPSULE | Freq: Every day | ORAL | 3 refills | Status: DC

## 2017-04-28 NOTE — Patient Instructions (Signed)
I sent in a medication for depression. I put in a referral to the hand surgeon.   Please see Dr. Pollie MeyerMcIntyre in one month.

## 2017-04-28 NOTE — Progress Notes (Signed)
Integrated Behavioral Health Warm Handoff Total time:20 minutes Type of Service: Integrated Behavioral Health- Individual  Interpretor:Yes.    Interpreter Name and Language: Debarah CrapeClaudia # [phone]  SUBJECTIVE: Rita PurpuraMaria C Sanchez is a 46 y.o. female  referred by Dr. Leveda AnnaHensel for:  depression.  Patient reports the following symptoms: decreased appetite, sleep disturbance and feeling down/bad about self Duration of problem:  Several month and had progressed Impact on function: does not feel like going to work Current or Hx of substance use: none PSYCHIATRIC HISTORY - Diagnosis: depression - Hospitalizations/ Outpatient therapy:  outpatient treatment and SI thoughts about 6 years ago.  Patient denies SI today. -Pharmacotherapy:Prozac in the past but stopped taking it.  New Rx received today  OBJECTIVE: Mood: Anxious ; Thought process: Coherent; Affect: Tearful Risk of harm to self or others: No plan to harm self or others  LIFE CONTEXT:  Family & Social: lives with daughter and granddaughter  School/ Work: works 12 years at [employer]  Life changes: none reported  GOALS ADDRESSED:  Patient will reduce symptoms of: depression; increase  ability WJ:[id number]:self-management skills.  INTERVENTIONS:  Mindfulness or Management consultantelaxation Training, Supportive Counseling and Link to [facility] Resources , Reflective listening Standardized Assessments completed: PHQ 9=17,Severity: moderately Severe.  ASSESSMENT:  Patient currently experiencing depression due to stop taking Prozac.  Patient may benefit from, and is in agreement to receive further assessment and therapeutic interventions to assist with managing her symptoms. Of[facility] and referral to spanish speaking counselor.  Patient chose information for spanish speaking therapist.  PLAN: 1. LCSW will F/U with phone call in 5 to 7 days [phone] 3. Behavioral recommendations: relaxed breathing 4. [facility]   Warm Hand  Off Completed.     Sammuel Hineseborah Aryka Coonradt, LCSW Licensed Clinical Social Worker [facility]   [phone] 11:59 AM

## 2017-04-29 LAB — TSH: TSH: 2.8 u[IU]/mL (ref 0.450–4.500)

## 2017-04-29 NOTE — Assessment & Plan Note (Signed)
History is a bit difficult due to language barrier and depression perhaps contributing to her somatic complaints.  HX and PE point to severe carpal tunnel which is longstanding and has failed conservative treatment.  Refer to hand surg for possible steroid injections and/or surgery.  Also checked a TSH which is normal.

## 2017-04-29 NOTE — Assessment & Plan Note (Signed)
Worse/recurrent.  Restart prozac.  Integrated care  FU 1 month with PCP.

## 2017-04-29 NOTE — Progress Notes (Signed)
   Subjective:    Patient ID: Rita Sanchez, female    DOB: 02/24/1971, 46 y.o.   MRN: [id number]  HPI Interpreter #[id number] SDA for severe right arm pain.  Issues: 1.  Longstanding dx of carpal tunnel bilateral.  States has bilateral hand numbness.  Pain is mostly at night and mostly at the elbow.  Does have multiple positive answers to questions about neck pain, upper arm pain - see problem #2.  Denies weakness.  Worse at night.  Sleeps in wrist splints.  No arm numbness.  No weakness.  Worse with work "I can't vacuum because of the pain. 2. Depression.  Crying in the office.  States she was feeling good and stopped her medications on her own.  Reviewing records, she was on Prozac 40.  Agreed to restart.  Also involved integrative care.  Denies SI or HI.   Review of Systems     Objective:   Physical Exam Provocative testing for carpal tunnel gives bilateral hand numbness and reproduces her right elbow pain. Greater than 50% of visit was counseling.  Duration of visit by me was 25 minutes.        Assessment & Plan:

## 2017-05-03 ENCOUNTER — Ambulatory Visit (INDEPENDENT_AMBULATORY_CARE_PROVIDER_SITE_OTHER): Payer: BLUE CROSS/BLUE SHIELD | Admitting: Orthopaedic Surgery

## 2017-05-03 ENCOUNTER — Encounter (INDEPENDENT_AMBULATORY_CARE_PROVIDER_SITE_OTHER): Payer: Self-pay | Admitting: Orthopaedic Surgery

## 2017-05-03 DIAGNOSIS — G5601 Carpal tunnel syndrome, right upper limb: Secondary | ICD-10-CM

## 2017-05-03 DIAGNOSIS — G5602 Carpal tunnel syndrome, left upper limb: Secondary | ICD-10-CM

## 2017-05-03 NOTE — Patient Instructions (Signed)
Sndrome del tnel carpiano (Carpal Tunnel Syndrome) El sndrome del tnel carpiano es una afeccin que causa dolor en la mano y en el brazo. El tnel carpiano es un espacio estrecho ubicado en el lado palmar de la mueca. Los movimientos de la mueca o ciertas enfermedades pueden causar hinchazn del tnel. Esta hinchazn comprime el nervio principal de la mueca (nervio mediano). CAUSAS Esta afeccin puede ser causada por lo siguiente:  Movimientos repetidos de la mueca.  Lesiones en la mueca.  Artritis.  Un quiste o un tumor en el tnel carpiano.  Acumulacin de lquido durante el embarazo. A veces, se desconoce la causa de esta afeccin. FACTORES DE RIESGO Es ms probable que esta afeccin se manifieste en:  Las personas que tienen trabajos en los que deben realizar los mismos movimientos repetidos de las muecas, como los carniceros y los cajeros.  Las mujeres.  Las personas que tienen determinadas enfermedades, por ejemplo: ? Diabetes. ? Obesidad. ? Tiroides hipoactiva (hipotiroidismo). ? Insuficiencia renal. SNTOMAS Los sntomas de esta afeccin incluyen lo siguiente:  Sensacin de hormigueo en los dedos de la mano, especialmente el pulgar, el ndice y el dedo medio.  Hormigueo o adormecimiento en la mano.  Sensacin de dolor en todo el brazo, especialmente cuando la mueca y el codo estn flexionados durante mucho tiempo.  Dolor en la mueca que sube por el brazo hasta el hombro.  Dolor que baja por la mano o los dedos.  Sensacin de debilidad en las manos. Tal vez tenga dificultad para tomar y sostener objetos. Los sntomas pueden empeorar durante la noche. DIAGNSTICO Esta afeccin se diagnostica mediante la historia clnica y un examen fsico. Tambin pueden hacerle exmenes, que incluyen los siguientes:  Electromiografa (EMG). Esta prueba mide las seales elctricas que los nervios les envan a los msculos.  Radiografas. TRATAMIENTO El tratamiento  de esta afeccin incluye lo siguiente:  Cambios en el estilo de vida. Es importante dejar de hacer o modificar la actividad que caus la afeccin.  Fisioterapia o terapia ocupacional.  Analgsicos y antiinflamatorios. Esto puede incluir medicamentos que se inyectan en la mueca.  Una frula para la mueca.  Ciruga. INSTRUCCIONES PARA EL CUIDADO EN EL HOGAR Si tiene una frula:  sela como se lo haya indicado el mdico. Qutesela solamente como se lo haya indicado el mdico.  Afloje la frula si los dedos se le entumecen, siente hormigueos o se le enfran y se tornan de color azul.  Mantenga la frula limpia y seca. Instrucciones generales  Tome los medicamentos de venta libre y los recetados solamente como se lo haya indicado el mdico.  Haga reposar la mueca de toda actividad que le cause dolor. Si la afeccin tiene relacin con el trabajo, hable con su empleador sobre los cambios que pueden hacerse, por ejemplo, usar una almohadilla para apoyar la mueca mientras tipea.  Si se lo indican, aplique hielo sobre la zona dolorida: ? Ponga el hielo en una bolsa plstica. ? Coloque una toalla entre la piel y la bolsa de hielo. ? Coloque el hielo durante 20minutos, 2 a 3veces por da.  Concurra a todas las visitas de control como se lo haya indicado el mdico. Esto es importante.  Haga los ejercicios como se lo hayan indicado el mdico, el fisioterapeuta o el terapeuta ocupacional. SOLICITE ATENCIN MDICA SI:  Aparecen nuevos sntomas.  El dolor no se alivia con los medicamentos.  Los sntomas empeoran.  Esta informacin no tiene como fin reemplazar el consejo del mdico. Asegrese de   hacerle al mdico cualquier pregunta que tenga. Document Released: 10/04/2005 Document Revised: 01/26/2016 Document Reviewed: 02/19/2015 Elsevier Interactive Patient Education  2017 Elsevier Inc.  

## 2017-05-03 NOTE — Progress Notes (Signed)
Office Visit Note   Patient: Rita Sanchez           Date of Birth: 06/13/1971           MRN: [id number] Visit Date: 05/03/2017              Requested by: Latrelle DodrillMcIntyre, Brittany J, MD [address] [place], [place] [address] PCP: Latrelle DodrillMcIntyre, Brittany J, MD   Assessment & Plan: Visit Diagnoses:  1. Right carpal tunnel syndrome   2. Left carpal tunnel syndrome     Plan: Overall impression is bilateral carpal tunnel syndrome. Nerve conduction studies ordered. Follow-up after nerve conduction studies. Questions encouraged and answered. Total face to face encounter time was greater than 45 minutes and over half of this time was spent in counseling and/or coordination of care.  Follow-Up Instructions: Return if symptoms worsen or fail to improve.   Orders:  No orders of the defined types were placed in this encounter.  No orders of the defined types were placed in this encounter.     Procedures: No procedures performed   Clinical Data: No additional findings.   Subjective: No chief complaint on file.   Patient comes in today with bilateral hand pain worse on the right for many years. She endorses pain, numbness, burning, tingling. She is a Advertising copywriterhousekeeper. She is right-hand dominant. Denies any injuries. Denies any treatments other than wrist braces which do help her at nighttime.    Review of Systems  Constitutional: Negative.   HENT: Negative.   Eyes: Negative.   Respiratory: Negative.   Cardiovascular: Negative.   Endocrine: Negative.   Musculoskeletal: Negative.   Neurological: Negative.   Hematological: Negative.   Psychiatric/Behavioral: Negative.   All other systems reviewed and are negative.    Objective: Vital Signs: LMP 04/20/2017 (Approximate)   Physical Exam  Constitutional: She is oriented to person, place, and time. She appears well-developed and well-nourished.  HENT:  Head: Normocephalic and atraumatic.  Eyes: EOM are normal.  Neck:  Neck supple.  Pulmonary/Chest: Effort normal.  Abdominal: Soft.  Neurological: She is alert and oriented to person, place, and time.  Skin: Skin is warm. Capillary refill takes less than 2 seconds.  Psychiatric: She has a normal mood and affect. Her behavior is normal. Judgment and thought content normal.  Nursing note and vitals reviewed.   Ortho Exam Bilateral hand exam shows no muscular atrophy or flattening. Positive carpal tunnel compression signs. APB motor function is normal. Specialty Comments:  No specialty comments available.  Imaging: No results found.   PMFS History: Patient Active Problem List   Diagnosis Date Noted  . Abdominal pain 01/18/2017  . Insomnia 01/18/2017  . Lesion of cervix 06/29/2016  . Carpal tunnel syndrome 04/02/2016  . Obesity 04/02/2016  . History of Bell's palsy 08/25/2014  . Allergic urticaria 08/10/2013  . Internal hemorrhoids 02/20/2013  . Headache(784.0) 01/14/2013  . Heart murmur 01/14/2013  . Hip pain 10/12/2012  . Back pain 10/04/2012  . Routine adult health maintenance 07/23/2012  . Tobacco abuse counseling 07/23/2012  . Depression 07/21/2012  . Peptic ulcer 07/21/2012   Past Medical History:  Diagnosis Date  . Arthritis   . Depression   . Peptic ulcer   . Seasonal allergies     Family History  Problem Relation Age of Onset  . Diabetes Mother   . Diabetes Father   . Heart disease Father     Past Surgical History:  Procedure Laterality Date  . CESAREAN SECTION  1986, 1988, 1991  . TUBAL LIGATION  1991   Social History   Occupational History  . Not on file.   Social History Main Topics  . Smoking status: Current Every Day Smoker    Types: Cigarettes  . Smokeless tobacco: Never Used     Comment: 2-3 cigs per day. Wants to quit. Will make appt for smoking cessation discussion.  . Alcohol use Yes     Comment: occasional alcohol use, less than once a month.  . Drug use: Unknown  . Sexual activity: Not on file

## 2017-05-03 NOTE — Addendum Note (Signed)
Addended by: Albertina ParrGARCIA, Daelyn Pettaway on: 05/03/2017 10:04 AM   Modules accepted: Orders

## 2017-05-10 ENCOUNTER — Telehealth: Payer: Self-pay | Admitting: Licensed Clinical Social Worker

## 2017-05-10 NOTE — Progress Notes (Signed)
Type of Service: Integrated Behavioral Health F/U phone call Interpretor:Yes.  Interpreter Name and Language: Jari FavreOscar # [phone] with [employer], Spanish   F/U phone call to patient reference interventions discussed and resources provided during joint visit with PCP.  LCSW left voice message via interpreter with additional resources for Spanish speaking counselors and for patient to call if additional information or resources are needed.  Sammuel Hineseborah Iori Gigante, LCSW Licensed Clinical Social Worker [facility]   [phone] 11:25 AM

## 2017-05-27 ENCOUNTER — Encounter (INDEPENDENT_AMBULATORY_CARE_PROVIDER_SITE_OTHER): Payer: Self-pay

## 2017-06-10 ENCOUNTER — Ambulatory Visit (INDEPENDENT_AMBULATORY_CARE_PROVIDER_SITE_OTHER): Payer: BLUE CROSS/BLUE SHIELD | Admitting: Family Medicine

## 2017-06-10 ENCOUNTER — Encounter: Payer: Self-pay | Admitting: Family Medicine

## 2017-06-10 VITALS — BP 92/58 | HR 56 | Temp 98.3°F | Ht 59.0 in | Wt 150.2 lb

## 2017-06-10 DIAGNOSIS — F32A Depression, unspecified: Secondary | ICD-10-CM

## 2017-06-10 DIAGNOSIS — E669 Obesity, unspecified: Secondary | ICD-10-CM | POA: Diagnosis not present

## 2017-06-10 DIAGNOSIS — R51 Headache: Secondary | ICD-10-CM

## 2017-06-10 DIAGNOSIS — R519 Headache, unspecified: Secondary | ICD-10-CM

## 2017-06-10 DIAGNOSIS — F329 Major depressive disorder, single episode, unspecified: Secondary | ICD-10-CM

## 2017-06-10 DIAGNOSIS — Z23 Encounter for immunization: Secondary | ICD-10-CM | POA: Diagnosis not present

## 2017-06-10 DIAGNOSIS — G5603 Carpal tunnel syndrome, bilateral upper limbs: Secondary | ICD-10-CM | POA: Diagnosis not present

## 2017-06-10 NOTE — Assessment & Plan Note (Signed)
Much improved on prozac. No SI/HI. Continue this medication. Follow up in 3 months.

## 2017-06-10 NOTE — Patient Instructions (Addendum)
  Continue prozac  Nerve conduction study phone #: [phone]  On your way out, schedule an appointment one morning to come back for fasting labs. Do not eat or drink anything other than water the morning of your lab appointment until after your labs are drawn.  See me in 3 months.  Be well, Dr. Pollie Meyer

## 2017-06-10 NOTE — Assessment & Plan Note (Signed)
Symptoms improved some with nightly cockup splints Gave phone # to call & schedule nerve conduction studies since they weren't able to reach her Follow up with hand surgery after studies

## 2017-06-10 NOTE — Progress Notes (Signed)
Date of Visit: 06/10/2017   HPI:  Spanish interpreter utilized during today's visit.  Depression: Last visit saw Dr. Leveda Anna and was started on Prozac 40 mg daily. Since then has been doing much better. Feels much better and is happy with this present dose. Denies any thoughts of harming herself or others  Carpal tunnel syndrome: Was seen by hand surgery who ordered nerve conduction studies. Patient was unable to be reached by the physiatry department to schedule nerve conduction studies. She is wearing nightly cockup splints and feels like her wrists are doing better. She is still willing to get the nerve conduction studies, but is not as interested in surgery at this time.  BP low: BP noted low today. Patient denies any lightheadedness or dizziness, is asymptomatic. Does endorse having occasional nausea and headache about 2-3 times per month right after her period. This has been going on for the last 3-4 months. Denies any speech problems, numbness, tingling, weakness. Takes Tylenol which helps.   ROS: See HPI.  PMFSH: history of depression, carpal tunnel syndrome, obesity  PHYSICAL EXAM: BP (!) 92/58   Pulse (!) 56   Temp 98.3 F (36.8 C) (Oral)   Ht 4\' 11"  (1.499 m)   Wt 150 lb 3.2 oz (68.1 kg)   SpO2 98%   BMI 30.34 kg/m  Gen: no acute distress, pleasant, cooperative HEENT: normocephalic, atraumatic, moist mucous membranes  Ext: no thenal atrophy bilaterally. Grip 5/5 bilaterally. Psych: normal range of affect, well groomed, speech normal in rate and volume, normal eye contact  Neuro: grossly nonfocal, speech normal, pupils equal round and reactive to light, gait normal  ASSESSMENT/PLAN:  Health maintenance:  -patient states she will schedule her mammogram -flu shot given today  -advised to schedule fasting lab appointment to check lipids & CMET  Depression Much improved on prozac. No SI/HI. Continue this medication. Follow up in 3 months.  Carpal tunnel  syndrome Symptoms improved some with nightly cockup splints Gave phone # to call & schedule nerve conduction studies since they weren't able to reach her Follow up with hand surgery after studies  Headache Mild headache & nausea occurring after periods for the last several months, noted only on ROS May be part of menopausal syndrome given age & association with periods Not concerning to patient, continue as needed tylenol  Low blood pressure - asymptomatic. Tends to run on lower side based on prior readings. No workup at this time.  FOLLOW UP: Follow up in 3 months with me for depression Schedule lab visit  [place] J. Pollie Meyer, MD [facility]

## 2017-06-10 NOTE — Assessment & Plan Note (Signed)
Mild headache & nausea occurring after periods for the last several months, noted only on ROS May be part of menopausal syndrome given age & association with periods Not concerning to patient, continue as needed tylenol

## 2017-08-14 ENCOUNTER — Emergency Department (HOSPITAL_COMMUNITY): Payer: BLUE CROSS/BLUE SHIELD

## 2017-08-14 ENCOUNTER — Encounter (HOSPITAL_COMMUNITY): Payer: Self-pay | Admitting: Emergency Medicine

## 2017-08-14 ENCOUNTER — Emergency Department (HOSPITAL_COMMUNITY)
Admission: EM | Admit: 2017-08-14 | Discharge: 2017-08-14 | Disposition: A | Discharge status: Home / Self Care | Payer: BLUE CROSS/BLUE SHIELD | Attending: Emergency Medicine | Admitting: Emergency Medicine

## 2017-08-14 DIAGNOSIS — F17210 Nicotine dependence, cigarettes, uncomplicated: Secondary | ICD-10-CM | POA: Insufficient documentation

## 2017-08-14 DIAGNOSIS — M79601 Pain in right arm: Secondary | ICD-10-CM | POA: Diagnosis not present

## 2017-08-14 DIAGNOSIS — R0789 Other chest pain: Secondary | ICD-10-CM | POA: Insufficient documentation

## 2017-08-14 DIAGNOSIS — M25511 Pain in right shoulder: Secondary | ICD-10-CM | POA: Diagnosis not present

## 2017-08-14 LAB — CBC
HCT: 39.7 % (ref 36.0–46.0)
HEMOGLOBIN: 13.8 g/dL (ref 12.0–15.0)
MCH: 30.9 pg (ref 26.0–34.0)
MCHC: 34.8 g/dL (ref 30.0–36.0)
MCV: 89 fL (ref 78.0–100.0)
Platelets: 284 10*3/uL (ref 150–400)
RBC: 4.46 MIL/uL (ref 3.87–5.11)
RDW: 12.4 % (ref 11.5–15.5)
WBC: 6.9 10*3/uL (ref 4.0–10.5)

## 2017-08-14 LAB — BASIC METABOLIC PANEL
ANION GAP: 9 (ref 5–15)
BUN: 12 mg/dL (ref 6–20)
CALCIUM: 8.9 mg/dL (ref 8.9–10.3)
CO2: 20 mmol/L — AB (ref 22–32)
Chloride: 108 mmol/L (ref 101–111)
Creatinine, Ser: 0.6 mg/dL (ref 0.44–1.00)
GFR calc non Af Amer: >60 mL/min (ref >60)
Glucose, Bld: 108 mg/dL — ABNORMAL HIGH (ref 65–99)
Potassium: 3.6 mmol/L (ref 3.5–5.1)
Sodium: 137 mmol/L (ref 135–145)

## 2017-08-14 LAB — I-STAT TROPONIN, ED: TROPONIN I, POC: 0 ng/mL (ref 0.00–0.08)

## 2017-08-14 MED ORDER — PREDNISONE 20 MG PO TABS
60.0000 mg | ORAL_TABLET | Freq: Once | ORAL | Status: AC
  Administered 2017-08-14: 60 mg via ORAL
  Filled 2017-08-14: qty 3

## 2017-08-14 MED ORDER — ACETAMINOPHEN 325 MG PO TABS
650.0000 mg | ORAL_TABLET | Freq: Once | ORAL | Status: AC
  Administered 2017-08-14: 650 mg via ORAL
  Filled 2017-08-14: qty 2

## 2017-08-14 MED ORDER — PREDNISONE 20 MG PO TABS: 20.0000 mg | ORAL_TABLET | Freq: Two times a day (BID) | ORAL | 0 refills | Status: DC

## 2017-08-14 NOTE — ED Notes (Signed)
Patient returned from XRAY 

## 2017-08-14 NOTE — ED Provider Notes (Signed)
[facility] EMERGENCY DEPARTMENT Provider Note   CSN: [id number] Arrival date & time: 08/14/17  1123     History   Chief Complaint Chief Complaint  Patient presents with  . Arm Pain    HPI Rita Sanchez is a 46 y.o. female.  She is here for evaluation of neck pain radiating to right shoulder, right arm, and right chest, which started this morning.  No recent trauma.  She works as a Advertising copywriter.  She saw her PCP several months ago for tingling in hands, was referred to Ortho, who recommended nerve conduction study.  This was not done.  She improved with using cockup splints.  No recent trauma.  She does have pain in her right arm when she works as a Advertising copywriter.  She is especially bothered by vacuuming.  She denies fever, chills, nausea, vomiting.  She is here with a friend who translates for her.  There are no other known modifying factors.   HPI  Past Medical History:  Diagnosis Date  . Arthritis   . Depression   . Peptic ulcer   . Seasonal allergies     Patient Active Problem List   Diagnosis Date Noted  . Abdominal pain 01/18/2017  . Insomnia 01/18/2017  . Lesion of cervix 06/29/2016  . Carpal tunnel syndrome 04/02/2016  . Obesity 04/02/2016  . History of Bell's palsy 08/25/2014  . Allergic urticaria 08/10/2013  . Internal hemorrhoids 02/20/2013  . Headache 01/14/2013  . Heart murmur 01/14/2013  . Hip pain 10/12/2012  . Back pain 10/04/2012  . Routine adult health maintenance 07/23/2012  . Tobacco abuse counseling 07/23/2012  . Depression 07/21/2012  . Peptic ulcer 07/21/2012    Past Surgical History:  Procedure Laterality Date  . CESAREAN SECTION  1986, 1988, 1991  . CHOLECYSTECTOMY    . TUBAL LIGATION  1991    OB History    No data available       Home Medications    Prior to Admission medications   Medication Sig Start Date End Date Taking? Authorizing Provider  Elastic Bandages & Supports (WRIST  SPLINT/COCK-UP/LEFT M) MISC Wear nightly 03/31/16   Latrelle Dodrill, MD  Elastic Bandages & Supports (WRIST SPLINT/COCK-UP/RIGHT M) MISC Wear nightly 03/31/16   Latrelle Dodrill, MD  FLUoxetine (PROZAC) 40 MG capsule Take 1 capsule (40 mg total) by mouth daily. 04/28/17   Moses Manners, MD  predniSONE (DELTASONE) 20 MG tablet Take 1 tablet (20 mg total) by mouth 2 (two) times daily. 08/14/17   Mancel Bale, MD    Family History Family History  Problem Relation Age of Onset  . Diabetes Mother   . Diabetes Father   . Heart disease Father     Social History Social History  Substance Use Topics  . Smoking status: Current Every Day Smoker    Types: Cigarettes  . Smokeless tobacco: Never Used     Comment: 2-3 cigs per day. Wants to quit. Will make appt for smoking cessation discussion.  . Alcohol use No     Allergies   Patient has no known allergies.   Review of Systems Review of Systems  All other systems reviewed and are negative.    Physical Exam Updated Vital Signs BP 117/68 (BP Location: Right Arm)   Pulse 64   Temp 97.7 F (36.5 C) (Oral)   Resp 16   Ht 5' (1.524 m)   Wt 65.8 kg (145 lb)   SpO2 98%   BMI  28.32 kg/m   Physical Exam  Constitutional: She is oriented to person, place, and time. She appears well-developed and well-nourished. No distress.  HENT:  Head: Normocephalic and atraumatic.  Eyes: Pupils are equal, round, and reactive to light. Conjunctivae and EOM are normal.  Neck: Normal range of motion and phonation normal. Neck supple.  Cardiovascular: Normal rate and regular rhythm.   Pulmonary/Chest: Effort normal and breath sounds normal. She exhibits no tenderness.  Abdominal: Soft. She exhibits no distension. There is no tenderness. There is no guarding.  Musculoskeletal: Normal range of motion.  Normal range of motion arms legs neck and back.  Mild tenderness right paravertebral musculature and right trapezius.  Neurological: She is  alert and oriented to person, place, and time. She exhibits normal muscle tone.  Skin: Skin is warm and dry.  Psychiatric: She has a normal mood and affect. Her behavior is normal. Judgment and thought content normal.  Nursing note and vitals reviewed.    ED Treatments / Results  Labs (all labs ordered are listed, but only abnormal results are displayed) Labs Reviewed  BASIC METABOLIC PANEL - Abnormal; Notable for the following:       Result Value   CO2 20 (*)    Glucose, Bld 108 (*)    All other components within normal limits  CBC  I-STAT TROPONIN, ED    EKG  EKG Interpretation None       Radiology Dg Chest 2 View  Result Date: 08/14/2017 CLINICAL DATA:  Right arm pain EXAM: CHEST  2 VIEW COMPARISON:  None available FINDINGS: There is no edema, consolidation, effusion, or pneumothorax. Normal heart size and mediastinal contours. Cholecystectomy clips. Unexpected confluent sclerotic appearance of the lower sternum in the lateral projection, with possible expansion. IMPRESSION: 1. No evidence of acute cardiopulmonary disease. 2. Sclerotic appearance of the lower sternum on the lateral projection; non emergent further imaging is recommended. This finding would be best assessed by CT, but given this area is prone to artifact it would be reasonable to obtain a sternum radiographic series to confirm persistence. Electronically Signed   By: Marnee Spring M.D.   On: 08/14/2017 13:11   Dg Cervical Spine Complete  Result Date: 08/14/2017 CLINICAL DATA:  Right-sided neck and radicular arm pain for 1 day. EXAM: CERVICAL SPINE - COMPLETE 4+ VIEW COMPARISON:  None. FINDINGS: There is no evidence of cervical spine fracture or prevertebral soft tissue swelling. Alignment is normal. No other significant bone abnormalities are identified. IMPRESSION: Negative cervical spine radiographs. Electronically Signed   By: Myles Rosenthal M.D.   On: 08/14/2017 14:08    Procedures Procedures (including  critical care time)  Medications Ordered in ED Medications  predniSONE (DELTASONE) tablet 60 mg (60 mg Oral Given 08/14/17 1303)  acetaminophen (TYLENOL) tablet 650 mg (650 mg Oral Given 08/14/17 1303)     Initial Impression / Assessment and Plan / ED Course  I have reviewed the triage vital signs and the nursing notes.  Pertinent labs & imaging results that were available during my care of the patient were reviewed by me and considered in my medical decision making (see chart for details).  Clinical Course as of Aug 14 1425  Wynelle Link Aug 14, 2017  1241 OLIVEA, SONNEN ZO:[id number] 14-Aug-2017 11:54:51 [facility]/ED ROUTINE RECORD Normal sinus rhythm Rightward axis Borderline ECG 48mm/s 82mm/mV 100Hz  9.0.4 12SL 241 CID: 59  Vent. rate 74 BPM PR interval 126 ms QRS duration 76 ms QT/QTc 386/428 ms  [EW]  Clinical Course User Index [EW] Mancel BaleWentz, Tiphany Fayson, MD   EKG is unchanged from prior, and reassuring.  No evidence for acute myocardial ischemia.  Patient Vitals for the past 24 hrs:  BP Temp Temp src Pulse Resp SpO2 Height Weight  08/14/17 1139 117/68 97.7 F (36.5 C) Oral 64 16 98 % 5' (1.524 m) 65.8 kg (145 lb)    2:26 PM Reevaluation with update and discussion. After initial assessment and treatment, an updated evaluation reveals clinical exam unchanged.  She states she feels better after the treatment which has been given. Amarissa Koerner L      Final Clinical Impressions(s) / ED Diagnoses   Final diagnoses:  Right arm pain    Nonspecific right arm and neck pain.  Suspect overuse during, since cervical x-ray is normal.  Discomfort could also be related to pre-existing symptoms of carpal tunnel syndrome.  Doubt fracture, cervical myelopathy or metabolic instability.  Nursing Notes Reviewed/ Care Coordinated Applicable Imaging Reviewed Interpretation of Laboratory Data incorporated into ED treatment  The patient appears reasonably screened and/or  stabilized for discharge and I doubt any other medical condition or other [facility] requiring further screening, evaluation, or treatment in the ED at this time prior to discharge.  Plan: Home Medications-OTC analgesia; Home Treatments-rest, heat to affected area, avoid using the arm.; return here if the recommended treatment, does not improve the symptoms; Recommended follow up-PCP checkup 1 week and as needed.   New Prescriptions New Prescriptions   PREDNISONE (DELTASONE) 20 MG TABLET    Take 1 tablet (20 mg total) by mouth 2 (two) times daily.     Mancel BaleWentz, Kenadi Miltner, MD 08/14/17 [phone]

## 2017-08-14 NOTE — Discharge Instructions (Signed)
There were no serious causes found for your discomfort, today.  Try using heat on the sore area 3 or 4 times a day, in the form of a heating pad, or warm compress.  For pain, take Tylenol 650 mg every 4 hours.  Use the prescription, prednisone to help treat inflammation.  Try to avoid using your right arm as much as possible when you are lifting or doing repetitive activities.

## 2017-08-14 NOTE — ED Notes (Signed)
Patient Alert and oriented X4. Stable and ambulatory. Patient verbalized understanding of the discharge instructions.  Patient belongings were taken by the patient.  

## 2017-08-14 NOTE — ED Triage Notes (Signed)
Pt c/o woke up with right sided arm pain that has increased. Pt reports pain "takes her breath away." Pain radiates from back of right shoulder to right chest. Pt reports nausea.

## 2017-08-14 NOTE — ED Notes (Signed)
Wentz MD at bedside. 

## 2018-01-20 ENCOUNTER — Encounter: Payer: BLUE CROSS/BLUE SHIELD | Admitting: Family Medicine

## 2018-02-06 ENCOUNTER — Other Ambulatory Visit (HOSPITAL_COMMUNITY)
Admission: RE | Admit: 2018-02-06 | Discharge: 2018-02-06 | Disposition: A | Discharge status: Home / Self Care | Payer: BLUE CROSS/BLUE SHIELD | Source: Ambulatory Visit | Attending: Family Medicine | Admitting: Family Medicine

## 2018-02-06 ENCOUNTER — Ambulatory Visit (INDEPENDENT_AMBULATORY_CARE_PROVIDER_SITE_OTHER): Payer: BLUE CROSS/BLUE SHIELD | Admitting: Family Medicine

## 2018-02-06 ENCOUNTER — Encounter: Payer: Self-pay | Admitting: Family Medicine

## 2018-02-06 ENCOUNTER — Other Ambulatory Visit: Payer: Self-pay

## 2018-02-06 VITALS — BP 98/62 | HR 66 | Temp 98.2°F | Ht 60.0 in | Wt 155.8 lb

## 2018-02-06 DIAGNOSIS — R350 Frequency of micturition: Secondary | ICD-10-CM | POA: Diagnosis not present

## 2018-02-06 DIAGNOSIS — Z124 Encounter for screening for malignant neoplasm of cervix: Secondary | ICD-10-CM | POA: Diagnosis present

## 2018-02-06 DIAGNOSIS — N3281 Overactive bladder: Secondary | ICD-10-CM

## 2018-02-06 DIAGNOSIS — N889 Noninflammatory disorder of cervix uteri, unspecified: Secondary | ICD-10-CM | POA: Diagnosis not present

## 2018-02-06 DIAGNOSIS — F32A Depression, unspecified: Secondary | ICD-10-CM

## 2018-02-06 DIAGNOSIS — Z113 Encounter for screening for infections with a predominantly sexual mode of transmission: Secondary | ICD-10-CM

## 2018-02-06 DIAGNOSIS — Z716 Tobacco abuse counseling: Secondary | ICD-10-CM

## 2018-02-06 DIAGNOSIS — Z3202 Encounter for pregnancy test, result negative: Secondary | ICD-10-CM

## 2018-02-06 DIAGNOSIS — F1490 Cocaine use, unspecified, uncomplicated: Secondary | ICD-10-CM | POA: Diagnosis not present

## 2018-02-06 DIAGNOSIS — Z131 Encounter for screening for diabetes mellitus: Secondary | ICD-10-CM

## 2018-02-06 DIAGNOSIS — Z13220 Encounter for screening for lipoid disorders: Secondary | ICD-10-CM

## 2018-02-06 DIAGNOSIS — F329 Major depressive disorder, single episode, unspecified: Secondary | ICD-10-CM

## 2018-02-06 LAB — POCT URINALYSIS DIP (MANUAL ENTRY)
Bilirubin, UA: NEGATIVE
GLUCOSE UA: NEGATIVE mg/dL
Ketones, POC UA: NEGATIVE mg/dL
Leukocytes, UA: NEGATIVE
Nitrite, UA: NEGATIVE
PH UA: 6.5 (ref 5.0–8.0)
Protein Ur, POC: NEGATIVE mg/dL
RBC UA: NEGATIVE
SPEC GRAV UA: 1.025 (ref 1.010–1.025)
UROBILINOGEN UA: 0.2 U/dL

## 2018-02-06 LAB — POCT URINE PREGNANCY: PREG TEST UR: NEGATIVE

## 2018-02-06 MED ORDER — OXYBUTYNIN CHLORIDE ER 5 MG PO TB24: 5.0000 mg | ORAL_TABLET | Freq: Every day | ORAL | 1 refills | Status: DC

## 2018-02-06 NOTE — Progress Notes (Addendum)
Date of Visit: 02/06/2018   HPI:  Patient presents today for a well woman exam. Spanish interpreter utilized during today's visit.   Concerns today:  - wants to be checked for diabetes.  - Going to bathroom frequently. 5-6 times per week for 2-3 months. Also peeing a lot during the day. No dysuria. No incontinence.  Periods: period every 3-6 months, none in the last 2 month. No intermenstrual bleeding. Contraception: history tubal ligation Pelvic symptoms: sometimes mild pelvic pain, thinks she gets this just because she's scared of infection. Sexual activity: one partner female, for 17 years STD Screening: wants to get STD testing today. Concerned that partner has been with other partners. Desires oral, vaginal, and rectal swabs be done. Pap smear status: due for pap in July, last pap July 2014. Will do today. Exercise: works a lot, does housekeeping Diet: not trying to eat healthy, states she eats a typical [place] diet Smoking: 2-3 cigarettes per day. Wants to quit but says she can't. Can go up to 5 days without smoking.  Alcohol: one beer every 2-3 months Drugs: does occasional cocaine, snorts when she drinks beer. Avoids places where she knows it will be. No use at home.  Does it every 3-4 months.  Dentist: has dentist, seeing them regularly, just got filling last month  Depression: not taking prozac regularly, takes it as needed. Mood has been down lately. No SI/HI. Was doing better when taking prozac consistently.  ROS: See HPI  PMFSH:  Cancers in family: none   PHYSICAL EXAM: BP 98/62   Pulse 66   Temp 98.2 F (36.8 C) (Oral)   Ht 5' (1.524 m)   Wt 155 lb 12.8 oz (70.7 kg)   SpO2 97%   BMI 30.43 kg/m  Gen: NAD, pleasant, cooperative HEENT: NCAT, PERRL, no palpable thyromegaly or anterior cervical lymphadenopathy Heart: RRR, no murmurs Lungs: CTAB, NWOB Abdomen: soft, nontender to palpation Neuro: grossly nonfocal, speech normal GU: normal appearing external  genitalia without lesions. Vagina is moist with white discharge. Cervix with continued raised lesion at 11oclock position, similar to prior exams. No cervical motion tenderness or tenderness on bimanual exam. No adnexal masses.   ASSESSMENT/PLAN:  Health maintenance:  -STD screening: check gc/chlamydia with pap, also oral and rectal swabs. Check HIV & RPR with labs at fasting lab appointment  -pap smear: done today, cytology and HPV -mammogram: ordered, handout given -lipid screening: return for fasting labs to screen, also check A1c with fasting labs -immunizations: UTD -handout given on health maintenance topics  Cocaine use Occasional use every 3-4 months. Strongly encouraged patient to cease all cocaine use, reviewed negative impact on health. Patient motivated to stop using.  Lesion of cervix Remains present on today's exam, but similar in appearance to prior exams. Had prior biopsy done in colpo clinic which was benign. Continue monitoring.  Depression Mood worsening in setting of only using prozac as needed. Encouraged daily use. Follow up in 6 weeks to see how mood is doing.  Overactive bladder Urinary frequency without incontinence. Normal UA today. Checking STD screen. 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 Follow up in 6 weeks to see how medication is working.  Tobacco abuse counseling Encouraged smoking cessation, gave handout with smoking cessation tips.   FOLLOW UP: Follow up in 6 weeks for OAB and depression.  [place]ada[place] J. Pollie MeyerMcIntyre, MD [facility]

## 2018-02-06 NOTE — Patient Instructions (Addendum)
On your way out, schedule an appointment one morning to come back for fasting labs. Do not eat or drink anything other than water the morning of your lab appointment until after your labs are drawn.  Will call with lab results.  Sent medication for your urine to try  Get back on prozac  Follow up with me in 6 weeks.    [place] (Health Maintenance, Female) Un estilo de vida saludable y los cuidados preventivos pueden favorecer considerablemente a la salud y Musician. Pregunte a su mdico cul es el cronograma de exmenes peridicos apropiado para usted. Esta es una buena oportunidad para consultarlo sobre cmo prevenir enfermedades y [place] sano. Adems de los controles, hay muchas otras cosas que puede hacer usted mismo. Los expertos han realizado numerosas investigaciones [employer] cambios en el estilo de vida y las medidas de prevencin que, [place], lo ayudarn a mantenerse sano. Solicite a su mdico ms informacin. EL PESO Y LA DIETA Consuma una dieta saludable.  Asegrese de [employer] verduras, frutas, productos lcteos de bajo contenido de [place] y Advertising account planner.  No consuma muchos alimentos de alto contenido de grasas slidas, azcares agregados o sal.  Realice actividad fsica con regularidad. Esta es una de las prcticas ms importantes que puede hacer por su salud. ? La Delorise Shiner de los adultos deben hacer ejercicio durante al menos 154mnutos por semana. El ejercicio debe aumentar la frecuencia cardaca y pActorla transpiracin (ejercicio de i[place]. ? La mayora de los adultos tambin deben hacer ejercicios de elongacin al mToysRusveces a la semana. Agregue esto al su plan de ejercicio de intensidad moderada. Mantenga un peso saludable.  El ndice de masa corporal ([facility] es una medida que puede utilizarse para identificar posibles problemas de pSouth [place] Proporciona una estimacin de la grasa corporal basndose  en el peso y la altura. Su mdico puede ayudarle a dRadiation protection practitionerIJacksonvilley a lScientist, forensico mTheatre managerun peso saludable.  Para las mujeres de 20aos o ms: ? Un IWise Regional Health Inpatient Rehabilitationmenor de 18,5 se considera bajo peso. ? Un ISt John Medical Centerentre 18,5 y 24,9 es normal. ? Un ISarasota Phyiscians Surgical Centerentre 25 y 29,9 se considera sobrepeso. ? Un IMC de 30 o ms se considera obesidad. Observe los niveles de colesterol y lpidos en la sangre.  Debe comenzar a rEnglish as a second language teacherde lpidos y cResearch officer, trade unionen la sangre a los 20aos y luego repetirlos cada 574aos  Es posible que nAutomotive engineerlos niveles de colesterol con mayor frecuencia si: ? Sus niveles de lpidos y colesterol son altos. ? Es mayor de [id number] ? Presenta un alto riesgo de padecer enfermedades cardacas. DETECCIN DE CNCER Cncer de pulmn  Se recomienda realizar exmenes de deteccin de cncer de pulmn a personas adultas entre 579y 834aos que estn en riesgo de dHorticulturist, commercialde pulmn por sus antecedentes de consumo de tabaco.  Se recomienda una tomografa computarizada de baja dosis de los pulmones todos los aos a las personas que: ? Fuman actualmente. ? Hayan dejado el hbito en algn momento en los ltimos 15aos. ? Hayan fumado durante 30aos un paquete diario. Un paquete-ao equivale a fumar un promedio de un paquete de cigarrillos diario durante un ao.  Los exmenes de deteccin anuales deben continuar hasta que hayan pasado 15aos desde que dej de fumar.  Ya no debern realizarse si tiene un problema de salud que le impida recibir tratamiento para eScience writerde pulmn. CTime  autoconciencia de Scientist, research (medical). Esto significa reconocer la apariencia normal de sus mamas y cmo las siente.  Tambin significa realizar autoexmenes regulares de [employer]. Informe a su mdico sobre cualquier cambio, sin importar cun pequeo sea.  Si tiene entre 20 y 68 aos, un mdico debe realizarle un examen clnico de las mamas como parte del examen regular de  [place], cada 1 a 3aos.  Si tiene 40aos o ms, debe Information systems manager clnico de las Microsoft. Tambin considere realizarse una [place] ([place]) todos los [place].  Si tiene antecedentes familiares de cncer de mama, hable con su mdico para someterse a un estudio gentico.  Si tiene alto riesgo de Chief Financial Officer de mama, hable con su mdico para someterse a Public house manager y [employer].  La evaluacin del gen del cncer de mama (BRCA) se recomienda a mujeres que tengan familiares con cnceres relacionados con el BRCA. Los cnceres relacionados con el BRCA incluyen los siguientes: ? [place]. ? Ovario. ? Trompas. ? Cnceres de peritoneo.  Los resultados de la evaluacin determinarn la necesidad de asesoramiento gentico y de [place] de BRCA1 y BRCA2. Cncer de cuello del tero El mdico puede recomendarle que se haga pruebas peridicas de deteccin de cncer de los rganos de la pelvis (ovarios, tero y vagina). Estas pruebas incluyen un examen plvico, que abarca controlar si se produjeron cambios microscpicos en la superficie del cuello del tero (prueba de Papanicolaou). Pueden recomendarle que se haga estas pruebas cada 3aos, a partir de los 21aos.  A las mujeres que tienen entre 30 y 17aos, los mdicos pueden recomendarles que se sometan a exmenes plvicos y pruebas de Papanicolaou cada 49aos, o a la prueba de Papanicolaou y el examen plvico en combinacin con estudios de deteccin del virus del papiloma humano (VPH) cada 5aos. Algunos tipos de VPH aumentan el riesgo de Chief Financial Officer de cuello del tero. La prueba para la deteccin del VPH tambin puede realizarse a mujeres de cualquier edad cuyos resultados de la prueba de Papanicolaou no sean claros.  Es posible que otros mdicos no recomienden exmenes de deteccin a mujeres no embarazadas que se consideran sujetos de bajo riesgo de Chief Financial Officer de pelvis y que no  tienen sntomas. Pregntele al mdico si un examen plvico de deteccin es adecuado para usted.  Si ha recibido un tratamiento para Science writer cervical o una enfermedad que podra causar cncer, necesitar realizarse una prueba de Papanicolaou y controles durante al menos 42 aos de concluido el [place]. Si no se ha hecho el Papanicolaou con regularidad, debern volver a evaluarse los factores de riesgo (como tener un nuevo compaero sexual), para Teacher, adult education si debe realizarse los estudios nuevamente. Algunas mujeres sufren problemas mdicos que aumentan la probabilidad de Museum/gallery curator cncer de cuello del tero. En estos casos, el mdico podr [employer] se realicen controles y pruebas de Papanicolaou con ms frecuencia. Cncer colorrectal  Este tipo de cncer puede detectarse y a menudo prevenirse.  Por lo general, los estudios de rutina se deben Medical laboratory scientific officer a Field seismologist a Proofreader de los 28 aos y [place] 87 aos.  Sin embargo, el mdico podr aconsejarle que lo haga antes, si tiene factores de riesgo para el cncer de colon.  Tambin puede recomendarle que use un kit de prueba para Hydrologist en la materia fecal.  Es posible que se use una pequea cmara en el extremo de un tubo para examinar directamente el colon (sigmoidoscopia  o colonoscopia) a fin de Hydrographic surveyor formas tempranas de cncer colorrectal.  Los exmenes de rutina generalmente comienzan a los 50aos.  El examen directo del colon se debe repetir cada 5 a 10aos hasta los 75aos. Sin embargo, es posible que se realicen exmenes con mayor frecuencia, si se detectan formas tempranas de plipos precancerosos o pequeos bultos. Cncer de piel  Revise la piel de la cabeza a los pies con regularidad.  Informe a su mdico si aparecen nuevos lunares o los que tiene se modifican, especialmente en su forma y color.  Tambin notifique al mdico si tiene un lunar que es ms grande que el tamao de una goma de lpiz.  Siempre use  pantalla solar. Aplique pantalla solar de Kerry Dory y repetida a lo largo del Training and development officer.  Protjase usando mangas y [employer], un sombrero de ala ancha y gafas para el sol, siempre que se encuentre en el exterior. ENFERMEDADES CARDACAS, DIABETES E HIPERTENSIN ARTERIAL  La hipertensin arterial causa enfermedades cardacas y [place] el riesgo de ictus. La hipertensin arterial es ms probable en los siguientes casos: ? Las personas que tienen la presin arterial en el extremo del rango normal (100-139/85-89 mm Hg). ? [employer] con sobrepeso u obesidad. ? Scientist, water quality.  Si usted tiene entre 18 y 39 aos, debe medirse la presin arterial cada 3 a 5 aos. Si usted tiene 40 aos o ms, debe medirse la presin arterial [employer]lett[employer]. Debe medirse la presin arterial dos veces: una vez cuando est en un hospital o una clnica y la otra vez cuando est en otro sitio. Registre el promedio de [employer]. Para controlar su presin arterial cuando no est en un hospital o [place], puede usar lo siguiente: ? Jorje Guild automtica para medir la presin arterial en una farmacia. ? Un monitor para medir la presin arterial en el hogar.  Si tiene entre 95 y 27 aos, consulte a su mdico si debe tomar aspirina para prevenir el ictus.  Realcese exmenes de deteccin de la diabetes con regularidad. Esto incluye la toma de [place] de sangre para controlar el nivel de azcar en la sangre durante el [place]. ? Si tiene un peso normal y un bajo riesgo de padecer diabetes, realcese este anlisis cada tres aos despus de los 45aos. ? Si tiene sobrepeso y un alto riesgo de padecer diabetes, considere someterse a este anlisis antes o con mayor frecuencia. PREVENCIN DE INFECCIONES HepatitisB  Si tiene un riesgo ms alto de Museum/gallery curator hepatitis B, debe someterse a un examen de deteccin de este virus. Se considera que tiene un alto riesgo de contraer hepatitis B si: ? Naci en  un pas donde la hepatitis B es frecuente. Pregntele a su mdico qu pases son considerados de Public affairs consultant. ? Sus padres nacieron en un pas de alto riesgo y usted no recibi una vacuna que lo proteja contra la hepatitis B (vacuna contra la hepatitis B). ? [place]. ? [place] agujas para inyectarse drogas. ? Vive con alguien que tiene hepatitis B. ? Ha tenido sexo con alguien que tiene hepatitis B. ? Recibe tratamiento de hemodilisis. ? Toma ciertos medicamentos para el cncer, trasplante de rganos y afecciones autoinmunitarias. Hepatitis C  Se recomienda un anlisis de [place] para: ? [employer] 1945 y 1965. ? Todas las personas que tengan un riesgo de haber contrado hepatitis C. Enfermedades de transmisin sexual (ETS).  Debe realizarse pruebas de deteccin de enfermedades de  transmisin sexual (ETS), incluidas gonorrea y clamidia si: ? Es sexualmente [place] y es menor de 20BTD. ? Es mayor de 24aos, y Investment banker, operational informa que corre riesgo de tener este tipo de infecciones. ? La actividad sexual ha cambiado desde que le hicieron la ltima prueba de deteccin y tiene un riesgo mayor de Best boy clamidia o Radio broadcast assistant. Pregntele al mdico si usted tiene riesgo.  Si no tiene el VIH, pero corre riesgo de infectarse por el virus, se recomienda tomar diariamente un medicamento recetado para evitar la infeccin. Esto se conoce como profilaxis previa a la exposicin. Se considera que est en riesgo si: ? Es [place] sexualmente y no [place] preservativos habitualmente o no conoce el estado del VIH de sus Advertising copywriter. ? Se inyecta drogas. ? Es [place] sexualmente con Ardelia Mems pareja que tiene VIH. Consulte a su mdico para saber si tiene un alto riesgo de infectarse por el VIH. Si opta por comenzar la profilaxis previa a la exposicin, primero debe realizarse anlisis de deteccin del VIH. Luego, le harn anlisis cada 102mses mientras est tomando los medicamentos para la  profilaxis previa a la exposicin. [facility] Si es premenopusica y puede quedar ePomaria solicite a su mdico asesoramiento previo a la concepcin.  Si puede quedar embarazada, tome 400 a [id number]([id number]) de cido fAnheuser-Busch  Si desea evitar el embarazo, hable con su mdico sobre el control de la natalidad (anticoncepcin). OSTEOPOROSIS Y MENOPAUSIA  La osteoporosis es una enfermedad en la que los huesos pierden los minerales y la fuerza por el avance de la edad. El resultado pueden ser fracturas graves en los hColon El riesgo de osteoporosis puede identificarse con uArdelia Memsprueba de densidad sea.  Si tiene 65aos o ms, o si est en riesgo de sufrir osteoporosis y fracturas, pregunte a su mdico si debe someterse a exmenes.  Consulte a su mdico si debe tomar un suplemento de calcio o de vitamina D para reducir el riesgo de osteoporosis.  La menopausia puede presentar ciertos sntomas fsicos y rGaffer  La terapia de reemplazo hormonal puede reducir algunos de estos sntomas y rGaffer Consulte a su mdico para saber si la terapia de reemplazo hormonal es conveniente para usted. INSTRUCCIONES PARA EL CUIDADO EN EL HOGAR  Realcese los estudios de rutina de la salud, dentales y de lPublic librarian  MH[place]  No consuma ningn producto que contenga tabaco, lo que incluye cigarrillos, tabaco de mHigher education careers advisero cPsychologist, sport and exercise  Si est embarazada, no beba alcohol.  Si est amamantando, reduzca el consumo de alcohol y la frecuencia con la que consume.  Si es mujer y no est embarazada limite el consumo de alcohol a no ms de 1 medida por da. Una medida equivale a 12onzas de cerveza, 5onzas de vino o 1onzas de bebidas alcohlicas de alta graduacin.  No consuma drogas.  No comparta agujas.  Solicite ayuda a su mdico si necesita apoyo o informacin para abandonar las drogas.  Informe a su mdico si a menudo se siente  deprimido.  Notifique a su mdico si alguna vez ha sido vctima de abuso o si no se siente seguro en su hogar. Esta informacin no tiene cMarine scientistel consejo del mdico. Asegrese de hacerle al mdico cualquier pregunta que tenga. Document Released: 09/23/2011 Document Revised: 10/25/2014 Document Reviewed: 07/08/2015 Elsevier Interactive Patient Education  2018 EProspectpara dejar de fumar (Steps to Quit Smoking) Fumar tabaco es malo para su  salud. Puede afectar a casi cualquier rgano del cuerpo. Fumar lo pone a usted y a Public affairs consultant a su alrededor en riesgo de Sports administrator enfermedades graves de [place] plazo (crnicas). Dejar de fumar es difcil, pero es una de las mejores cosas que puede hacer por su salud. Nunca es muy tarde para dejar de fumar. [place]? Al dejar de fumar, se reduce el riesgo de contraer enfermedades y afecciones graves. Estas pueden incluir los siguientes:  Enfermedad o cncer de pulmn.  Cardiopata coronaria.  Ictus.  Infarto de miocardio.  Imposibilidad de tener hijos (infertilidad).  Huesos dbiles (osteoporosis) y huesos rotos (fracturas). La tos, las sibilancias y la falta de aire son sntomas que mejoran cuando deja de fumar. Es posible tambin que se enferme con Media planner. Si est embarazada, dejar de fumar la ayudar a reducir las probabilidades de Best boy un beb de bajo peso al nacer. QU PUEDO HACER PARA [place]? Pregntele al [employer] cosas que pueden ayudarlo a dejar el hbito. Algunos cosas que puede hacer (estrategias) incluyen:  Dejar de fumar de forma definitiva en lugar de ir reduciendo gradualmente la cantidad de cigarrillos durante un perodo.  Recibir asesoramiento psicolgico individual. Es ms probable que tenga xito si asiste a diversas sesiones de Merchant navy officer.  Usar recursos y sistemas de soporte, como, por  ejemplo: ? Charlas en lnea con un consejero. ? Lneas telefnicas para dejar de fumar. ? Materiales impresos de [place]. ? Grupos de apoyo o asesoramiento psicolgico grupal. ? Programas de mensajes de texto. ? Aplicaciones para telfonos celulares.  Tomar medicamentos. Algunos de estos medicamentos pueden contener nicotina. Si est embarazada o amamantando, no tome ningn medicamento para dejar de fumar, excepto que el mdico lo autorice. Hable con el mdico sobre el asesoramiento psicolgico o sobre otras cosas que [place]. Hable con el mdico sobre usar ms de una estrategia al [employer], [place], por ejemplo, tomar medicamentos y tambin recibir asesoramiento psicolgico. Esto puede facilitarle el proceso para dejar de fumar. QU PUEDO HACER PARA QUE DEJAR DE FUMAR SEA MS FCIL? Al principio, dejar de fumar puede parecer abrumador, pero hay muchas opciones que facilitan el [place]. Tome estas medidas:  Converse con su familia o sus amigos. Busque su apoyo y [place].  Llame a las lneas telefnicas que ayudan a dejar de fumar, pngase en contacto con grupos de apoyo o reciba asesoramiento de un consejero.  Pdale a la gente que fuma que no lo haga a su alrededor.  Evite los lugares que pueden despertar el deseo de fumar (disparadores), como, por ejemplo: ? Bares. ? Fiestas. ? reas para fumar en el trabajo.  Pase tiempo con personas que no fuman.  Disminuya todo tipo de estrs de la vida diaria. El estrs puede hacer que usted desee fumar. Pruebe estas cosas para disminuir el estrs: ? Practicar actividad fsica con regularidad. ? Practicar ejercicios de respiracin profunda. ? Practicar yoga. ? Medite. ? Realizar una visualizacin corporal. Para ello, cierre los ojos, concntrese en una zona del cuerpo a la vez desde la cabeza [employer] dedos de los pies y fjese qu partes del cuerpo estn tensas. Relaje los msculos de esas reas.  Descargue o compre aplicaciones  para telfonos mviles o tabletas que le ayuden a respetar el plan para dejar de fumar. Hay muchas aplicaciones gratuitas, como QuitGuide de los Centros para el Control y la Prevencin de Arboriculturist (CDC, Doctor, hospital for Disease  Control and Prevention). Puede hallar otros recursos de Cardinal Health.gov y en otros sitios web. Esta informacin no tiene Marine scientist el consejo del mdico. Asegrese de hacerle al mdico cualquier pregunta que tenga. Document Released: 11/06/2010 Document Revised: 12/27/2011 Document Reviewed: 02/18/2015 Elsevier Interactive Patient Education  [employer]in.

## 2018-02-07 LAB — CERVICOVAGINAL ANCILLARY ONLY
CHLAMYDIA, DNA PROBE: NEGATIVE
CHLAMYDIA, DNA PROBE: NEGATIVE
NEISSERIA GONORRHEA: NEGATIVE
Neisseria Gonorrhea: NEGATIVE

## 2018-02-08 LAB — CYTOLOGY - PAP
Chlamydia: NEGATIVE
Diagnosis: NEGATIVE
HPV: NOT DETECTED
NEISSERIA GONORRHEA: NEGATIVE
Trichomonas: NEGATIVE

## 2018-02-09 ENCOUNTER — Telehealth: Payer: Self-pay | Admitting: *Deleted

## 2018-02-09 DIAGNOSIS — N3281 Overactive bladder: Secondary | ICD-10-CM | POA: Insufficient documentation

## 2018-02-09 DIAGNOSIS — F1490 Cocaine use, unspecified, uncomplicated: Secondary | ICD-10-CM | POA: Insufficient documentation

## 2018-02-09 NOTE — Assessment & Plan Note (Signed)
Remains present on today's exam, but similar in appearance to prior exams. Had prior biopsy done in [facility] which was benign. Continue monitoring.

## 2018-02-09 NOTE — Telephone Encounter (Signed)
Attempted to call pt with spanish interpretor (paulette [phone]) and LMOVM for pt to call us back. Na Waldrip Bruna PotterBlount, CMA

## 2018-02-09 NOTE — Assessment & Plan Note (Signed)
Occasional use every 3-4 months. Strongly encouraged patient to cease all cocaine use, reviewed negative impact on health. Patient motivated to stop using.

## 2018-02-09 NOTE — Assessment & Plan Note (Signed)
Encouraged smoking cessation, gave handout with smoking cessation tips.

## 2018-02-09 NOTE — Telephone Encounter (Signed)
-----   Message from Latrelle DodrillBrittany J McIntyre, MD sent at 02/08/2018  5:50 PM EDT ----- Please let patient know that all STD tests were normal and pap smear was also normal. Thanks! Latrelle DodrillBrittany J McIntyre, MD

## 2018-02-09 NOTE — Assessment & Plan Note (Signed)
Mood worsening in setting of only using prozac as needed. Encouraged daily use. Follow up in 6 weeks to see how mood is doing.

## 2018-02-09 NOTE — Assessment & Plan Note (Signed)
Urinary frequency without incontinence. Normal UA today. Checking STD screen. 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 Follow up in 6 weeks to see how medication is working.

## 2018-02-10 ENCOUNTER — Encounter: Payer: Self-pay | Admitting: *Deleted

## 2018-02-10 NOTE — Telephone Encounter (Signed)
Letter created letter and sent to admin for them to mail. Srinidhi Landers Bruna PotterBlount, CMA

## 2018-05-05 ENCOUNTER — Telehealth: Payer: Self-pay | Admitting: Family Medicine

## 2018-05-05 NOTE — Telephone Encounter (Signed)
Called, no VM option. Please help with setting up f/u visit

## 2018-12-20 ENCOUNTER — Encounter (HOSPITAL_COMMUNITY): Payer: Self-pay | Admitting: Emergency Medicine

## 2018-12-20 ENCOUNTER — Emergency Department (HOSPITAL_COMMUNITY)
Admission: EM | Admit: 2018-12-20 | Discharge: 2018-12-20 | Disposition: A | Discharge status: Home / Self Care | Payer: Worker's Compensation | Attending: Emergency Medicine | Admitting: Emergency Medicine

## 2018-12-20 ENCOUNTER — Other Ambulatory Visit: Payer: Self-pay

## 2018-12-20 DIAGNOSIS — F17210 Nicotine dependence, cigarettes, uncomplicated: Secondary | ICD-10-CM | POA: Insufficient documentation

## 2018-12-20 DIAGNOSIS — Z7721 Contact with and (suspected) exposure to potentially hazardous body fluids: Secondary | ICD-10-CM | POA: Insufficient documentation

## 2018-12-20 DIAGNOSIS — Z23 Encounter for immunization: Secondary | ICD-10-CM | POA: Diagnosis not present

## 2018-12-20 DIAGNOSIS — F329 Major depressive disorder, single episode, unspecified: Secondary | ICD-10-CM | POA: Insufficient documentation

## 2018-12-20 DIAGNOSIS — Y990 Civilian activity done for income or pay: Secondary | ICD-10-CM | POA: Insufficient documentation

## 2018-12-20 DIAGNOSIS — Z9049 Acquired absence of other specified parts of digestive tract: Secondary | ICD-10-CM | POA: Insufficient documentation

## 2018-12-20 DIAGNOSIS — Z79899 Other long term (current) drug therapy: Secondary | ICD-10-CM | POA: Insufficient documentation

## 2018-12-20 DIAGNOSIS — F1490 Cocaine use, unspecified, uncomplicated: Secondary | ICD-10-CM | POA: Diagnosis not present

## 2018-12-20 LAB — RAPID HIV SCREEN (HIV 1/2 AB+AG)
HIV 1/2 ANTIBODIES: NONREACTIVE
HIV-1 P24 ANTIGEN - HIV24: NONREACTIVE

## 2018-12-20 MED ORDER — TETANUS-DIPHTH-ACELL PERTUSSIS 5-2.5-18.5 LF-MCG/0.5 IM SUSP
0.5000 mL | Freq: Once | INTRAMUSCULAR | Status: AC
  Administered 2018-12-20: 0.5 mL via INTRAMUSCULAR
  Filled 2018-12-20: qty 0.5

## 2018-12-20 NOTE — ED Notes (Signed)
Date and time results received: 12/20/18 1515 (use smartphrase ".now" to insert current time)  Test: Rapid HIV Critical Value: Non-reactive  Name of Provider Notified: Nicoletta Ba  Orders Received? Or Actions Taken?:

## 2018-12-20 NOTE — ED Provider Notes (Signed)
[facility]-EMERGENCY DEPT Provider Note   CSN: [id number] Arrival date & time: 12/20/18  1222    History   Chief Complaint Chief Complaint  Patient presents with  . Body Fluid Exposure    HPI Rita Sanchez is a [age] y.o. female with a hx of tobacco abuse, PUD, and depression who presents to the ER for body fluid exposure which occurred at work at 10:30AM. Patient works as a Programmer, applicationshouse keeper at [employer], she states she went to pick up a napkin near a trash can in a person's room and was stuck by a needle within the napkin. She states it was a minor stick, no concern for retained FB, states there was mild bleeding that quickly resolved.  No alleviating or aggravating factors.  She states the area is not painful.  She has no other complaints.  She denies numbness, tingling, weakness, fever, chills, or other areas of injury.  She states this happened a few years ago when she stuck herself in the abdomen, she received blood work, she did not receive prophylactic medicines.    Translator line utilized throughout Audiological scientistencounter.   HPI  Past Medical History:  Diagnosis Date  . Arthritis   . Depression   . Peptic ulcer   . Seasonal allergies     Patient Active Problem List   Diagnosis Date Noted  . Cocaine use 02/09/2018  . Overactive bladder 02/09/2018  . Abdominal pain 01/18/2017  . Insomnia 01/18/2017  . Lesion of cervix 06/29/2016  . Carpal tunnel syndrome 04/02/2016  . Obesity 04/02/2016  . History of Bell's palsy 08/25/2014  . Allergic urticaria 08/10/2013  . Internal hemorrhoids 02/20/2013  . Headache 01/14/2013  . Heart murmur 01/14/2013  . Hip pain 10/12/2012  . Back pain 10/04/2012  . Routine adult health maintenance 07/23/2012  . Tobacco abuse counseling 07/23/2012  . Depression 07/21/2012  . Peptic ulcer 07/21/2012    Past Surgical History:  Procedure Laterality Date  . CESAREAN SECTION  1986, 1988, 1991  . CHOLECYSTECTOMY    . TUBAL  LIGATION  1991     OB History   No obstetric history on file.      Home Medications    Prior to Admission medications   Medication Sig Start Date End Date Taking? Authorizing Provider  Elastic Bandages & Supports (WRIST SPLINT/COCK-UP/LEFT M) MISC Wear nightly 03/31/16   Latrelle DodrillMcIntyre, Brittany J, MD  Elastic Bandages & Supports (WRIST SPLINT/COCK-UP/RIGHT M) MISC Wear nightly 03/31/16   Latrelle DodrillMcIntyre, Brittany J, MD  FLUoxetine (PROZAC) 40 MG capsule Take 1 capsule (40 mg total) by mouth daily. 04/28/17   Moses MannersHensel, William A, MD  oxybutynin (DITROPAN XL) 5 MG 24 hr tablet Take 1 tablet (5 mg total) by mouth at bedtime. 02/06/18   Latrelle DodrillMcIntyre, Brittany J, MD    Family History Family History  Problem Relation Age of Onset  . Diabetes Mother   . Diabetes Father   . Heart disease Father     Social History Social History   Tobacco Use  . Smoking status: Current Every Day Smoker    Types: Cigarettes  . Smokeless tobacco: Never Used  . Tobacco comment: 2-3 cigs per day. Wants to quit. Will make appt for smoking cessation discussion.  Substance Use Topics  . Alcohol use: No  . Drug use: No     Allergies   Patient has no known allergies.   Review of Systems Review of Systems  Constitutional: Negative for chills and fever.  Gastrointestinal:  Negative for nausea and vomiting.  Skin: Positive for wound.  Neurological: Negative for weakness and numbness.     Physical Exam Updated Vital Signs BP 112/79 (BP Location: Left Arm)   Pulse 62   Temp 98.5 F (36.9 C) (Oral)   Resp 15   LMP 11/21/2018   SpO2 98%   Physical Exam Vitals signs and nursing note reviewed.  Constitutional:      General: She is not in acute distress.    Appearance: She is well-developed.  HENT:     Head: Normocephalic and atraumatic.  Eyes:     General:        Right eye: No discharge.        Left eye: No discharge.     Conjunctiva/sclera: Conjunctivae normal.  Cardiovascular:     Comments: 2+ symmetric  radial pulses. Musculoskeletal:     Comments: Upper extremities: No obvious deformity, appreciable swelling, erythema, ecchymosis, or appreciable breaks in the skin. Intact AROM throughout. Non tender.   Skin:    Capillary Refill: Capillary refill takes less than 2 seconds.  Neurological:     Mental Status: She is alert.     Comments: Clear speech.  Sensation grossly intact bilateral upper extremities.  5-5 symmetric grip strength.  Patient able to perform okay sign, thumbs up, and cross second and third digits bilaterally.  Psychiatric:        Behavior: Behavior normal.        Thought Content: Thought content normal.    ED Treatments / Results  Labs (all labs ordered are listed, but only abnormal results are displayed) Labs Reviewed - No data to display  EKG None  Radiology No results found.  Procedures Procedures (including critical care time)  Medications Ordered in ED Medications  Tdap (BOOSTRIX) injection 0.5 mL (has no administration in time range)     Initial Impression / Assessment and Plan / ED Course  I have reviewed the triage vital signs and the nursing notes.  Pertinent labs & imaging results that were available during my care of the patient were reviewed by me and considered in my medical decision making (see chart for details).    Patient presents following potential blood/bodily fluid exposure after needle stick at work. Unknown source of needle. No obvious wounds or appreciable FB. Nontender. NVI distal. Will obtain exposure panel.   Rapid HIV negative.  Discussed risks/benefits of HIV prophylaxis with patient- she has adamantly declined this. Unfortunately she does not have a PCP to follow up on ER results or for repeat testing. Social work was consulted, recommended case management- called and left voicemail for call back.   17:08: CONSULT: Discussed with Burna Mortimer RN w/ CM- has provided information in discharge instructions to facilitate appropriate follow  up.   Tdap updated. Patient to be discharged home. I discussed results, treatment plan, need for follow-up, and return precautions with the patient. Provided opportunity for questions, patient confirmed understanding and is in agreement with plan.   Of note patient's manager was present in the ER intermittently throughout her stay, he became frustrated/agitated with staff regarding patient's wait time, states he has been recording staff intermittently without their knowledge. I discussed with patient and her manager at length the purpose of extended ER stay was to ensure negative rapid HIV and to ensure appropriate follow up with case management. Apologized for length of stay. Provided opportunity for questions- confirmed understanding.   Final Clinical Impressions(s) / ED Diagnoses   Final diagnoses:  Exposure to  blood or body fluid    ED Discharge Orders    None       Cherly Anderson, PA-C 12/20/18 1750    Linwood Dibbles, MD 12/23/18 [phone]

## 2018-12-20 NOTE — Discharge Instructions (Addendum)
You were seen in the emergency department today after a needlestick.  Your rapid HIV test was negative.  We sent off an HIV antibody and hepatitis panel.  We will call you if these results are abnormal.  We will need to follow-up within 1 month for repeat blood work.  We have given you information for the [facility] health [place] clinic, call to make an appointment.  Return to the ER anytime for new or worsening symptoms or any other concerns.

## 2018-12-20 NOTE — Progress Notes (Signed)
Consult request has been received. CSW attempting to follow up at present time.  5:39 PM CSW confirmed consult was for CM. RN CM is aware.  Dorothe Pea. Kamau Weatherall, LCSW, LCAS, CSI Clinical Social Worker Ph: [phone]

## 2018-12-20 NOTE — ED Triage Notes (Signed)
Left middle finger stuck with needle while cleaning; denies pain; 1030 event.

## 2018-12-20 NOTE — Progress Notes (Signed)
ED CM consulted from [facility] ED concerning patient needing assistance after being stuck at work with a needle.  Patient was offered post exposure treatment patient declined. Requesting assistance with finding a PCP.  Patient is uninsured, recommended the Patient care Center information placed on AVS for patient to contact clinic tomorrow after 9a to schedule a follow up appointment. Updated S. Petrecilli PA-C

## 2018-12-21 LAB — HIV ANTIBODY (ROUTINE TESTING W REFLEX): HIV Screen 4th Generation wRfx: NONREACTIVE

## 2018-12-22 LAB — HEPATITIS PANEL, ACUTE
HCV Ab: <0.1 s/co ratio (ref 0.0–0.9)
Hep A IgM: NEGATIVE
Hep B C IgM: NEGATIVE
Hepatitis B Surface Ag: NEGATIVE

## 2019-04-25 IMAGING — DX DG CHEST 2V
2 series · 2 of 2 positions shown · non-contrast
Comparison: None available

CLINICAL DATA: Right arm pain

EXAM:
CHEST  2 VIEW

[chest lat]
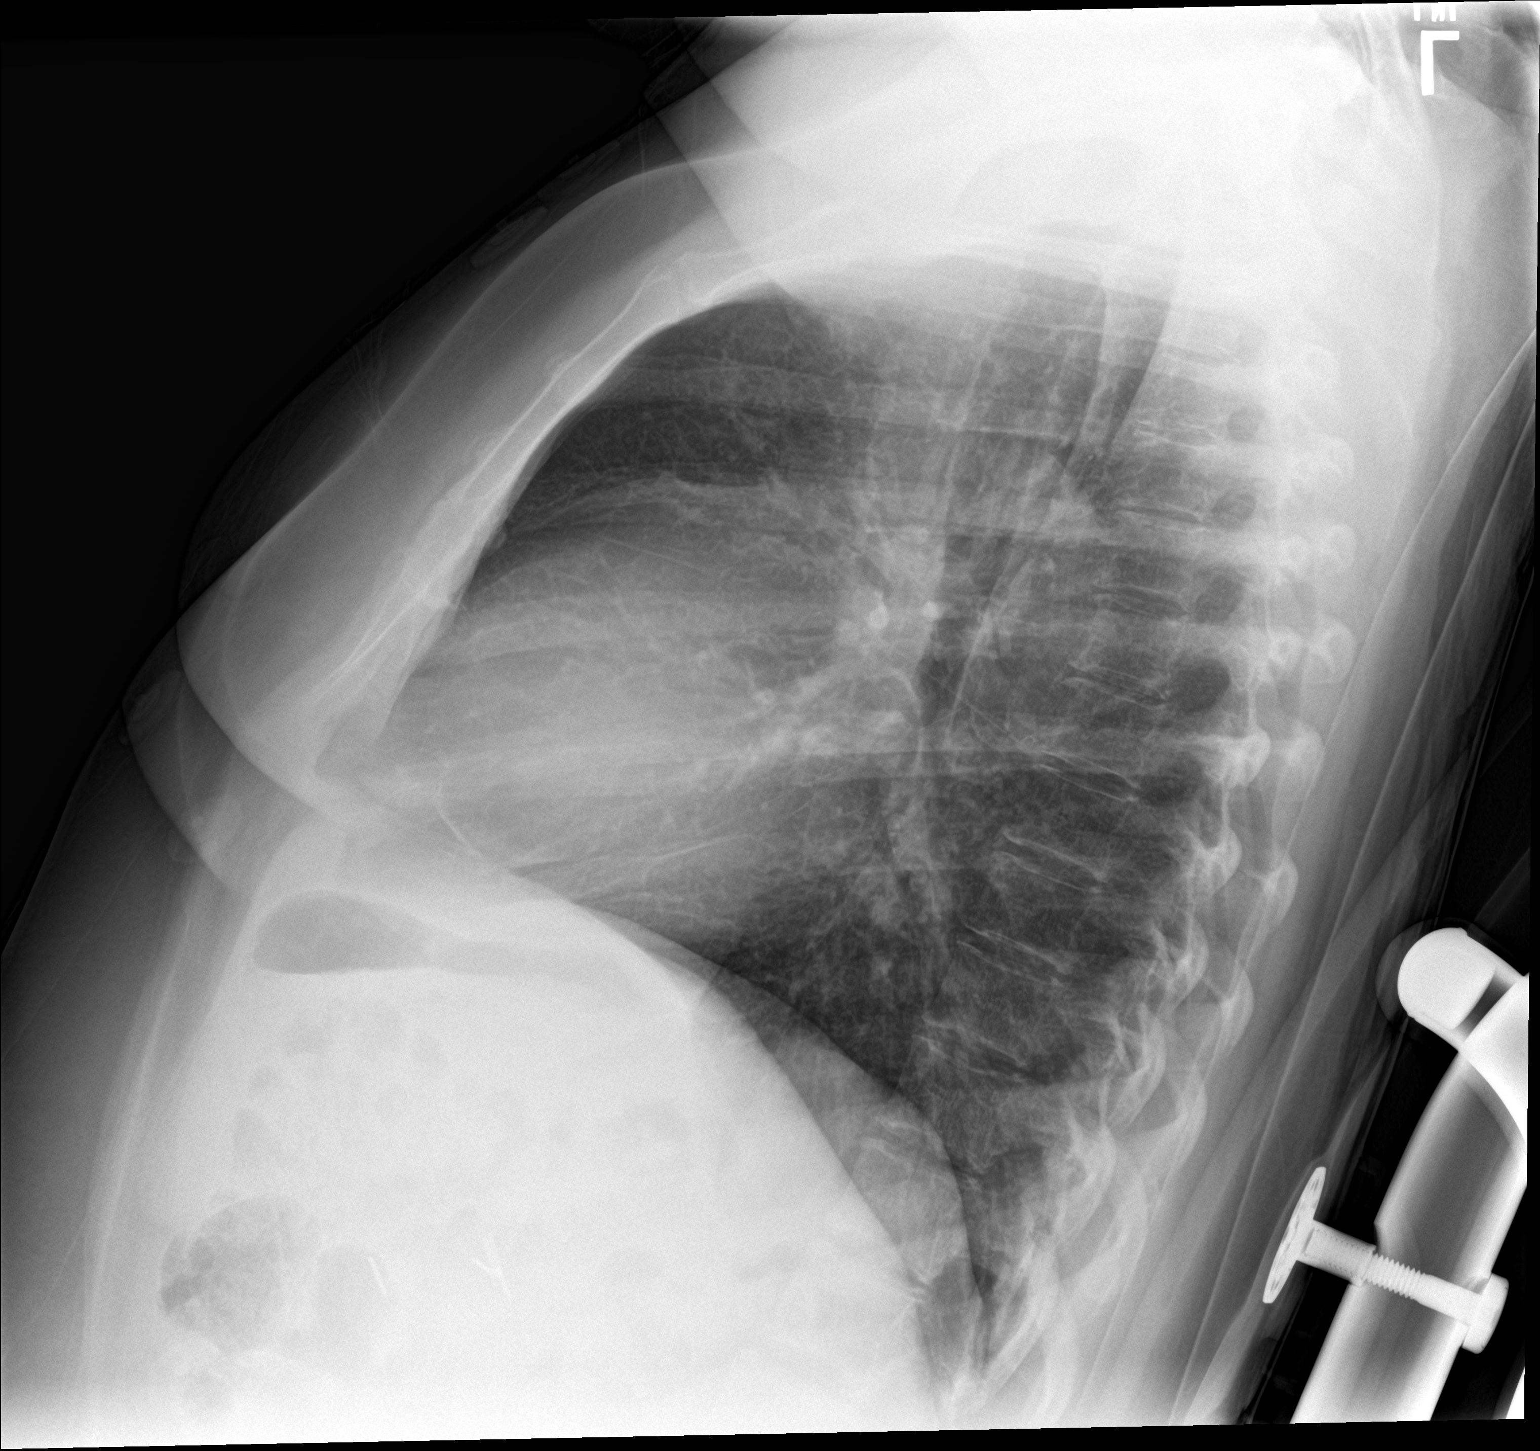

[chest pa]
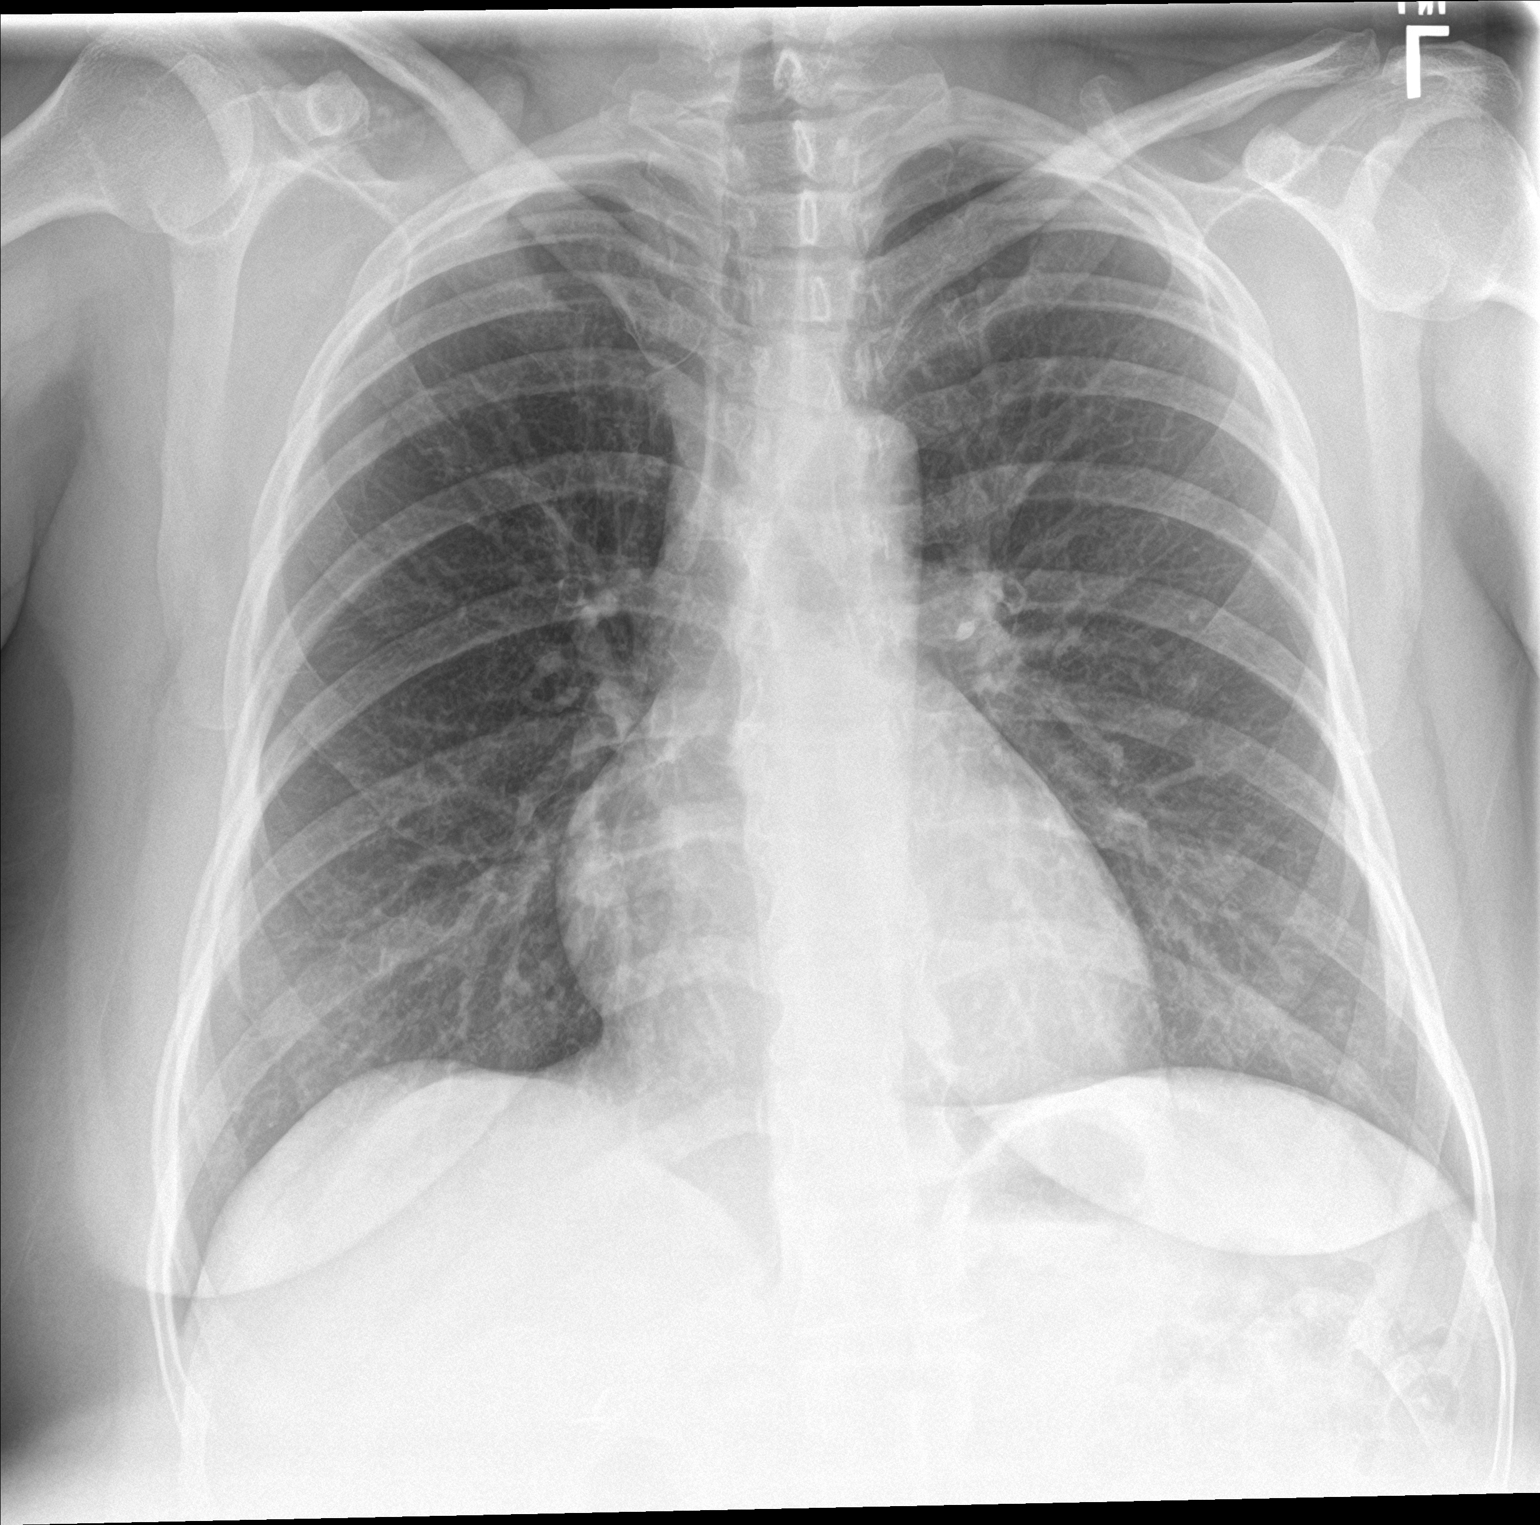

[2 of 2 positions shown; findings below may reference images not displayed]

FINDINGS: There is no edema, consolidation, effusion, or pneumothorax. Normal
heart size and mediastinal contours. Cholecystectomy clips.
Unexpected confluent sclerotic appearance of the lower sternum in
the lateral projection, with possible expansion.
IMPRESSION: 1. No evidence of acute cardiopulmonary disease.
2. Sclerotic appearance of the lower sternum on the lateral
projection; non emergent further imaging is recommended. This
finding would be best assessed by CT, but given this area is prone
to artifact it would be reasonable to obtain a sternum radiographic
series to confirm persistence.

## 2020-01-01 ENCOUNTER — Other Ambulatory Visit: Payer: Self-pay

## 2020-01-01 ENCOUNTER — Encounter: Payer: Self-pay | Admitting: Registered Nurse

## 2020-01-01 ENCOUNTER — Ambulatory Visit (INDEPENDENT_AMBULATORY_CARE_PROVIDER_SITE_OTHER): Payer: Self-pay | Admitting: Registered Nurse

## 2020-01-01 VITALS — BP 107/79 | HR 71 | Temp 97.6°F | Ht 60.0 in | Wt 175.6 lb

## 2020-01-01 DIAGNOSIS — N760 Acute vaginitis: Secondary | ICD-10-CM

## 2020-01-01 DIAGNOSIS — N949 Unspecified condition associated with female genital organs and menstrual cycle: Secondary | ICD-10-CM

## 2020-01-01 DIAGNOSIS — B9689 Other specified bacterial agents as the cause of diseases classified elsewhere: Secondary | ICD-10-CM

## 2020-01-01 DIAGNOSIS — B373 Candidiasis of vulva and vagina: Secondary | ICD-10-CM

## 2020-01-01 DIAGNOSIS — B3731 Acute candidiasis of vulva and vagina: Secondary | ICD-10-CM

## 2020-01-01 LAB — POCT WET + KOH PREP
Trich by wet prep: ABSENT
Yeast by KOH: ABSENT
Yeast by wet prep: ABSENT

## 2020-01-01 MED ORDER — FLUCONAZOLE 150 MG PO TABS: 150.0000 mg | ORAL_TABLET | Freq: Once | ORAL | 0 refills | Status: AC

## 2020-01-01 MED ORDER — METRONIDAZOLE 500 MG PO TABS: 500.0000 mg | ORAL_TABLET | Freq: Three times a day (TID) | ORAL | 0 refills | Status: DC

## 2020-01-01 NOTE — Patient Instructions (Signed)
° ° ° °  If you have lab work done today you will be contacted with your lab results within the next 2 weeks.  If you have not heard from us then please contact us. The fastest way to get your results is to register for My Chart. ° ° °IF you received an x-ray today, you will receive an invoice from [facility]. Please contact [facility] at [phone] with questions or concerns regarding your invoice.  ° °IF you received labwork today, you will receive an invoice from [facility]. Please contact [facility] at [phone] with questions or concerns regarding your invoice.  ° °Our billing staff will not be able to assist you with questions regarding bills from these companies. ° °You will be contacted with the lab results as soon as they are available. The fastest way to get your results is to activate your My Chart account. Instructions are located on the last page of this paperwork. If you have not heard from us regarding the results in 2 weeks, please contact this office. °  ° ° ° °

## 2020-01-01 NOTE — Progress Notes (Signed)
Acute Office Visit  Subjective:    Patient ID: Rita Sanchez, female    DOB: [birth year], 49 y.o.   MRN: [id number]  Chief Complaint  Patient presents with  . Vaginal Itching    patient has been experiencing vaginal itching for 8 days now with some burning that feels like hot chili peppers . per patient she think she has some kind of diabetes because someone told her that could be the case    HPI Patient is in today for vaginal itching  Started 8 days ago Worsening Some odor, no discharge Monogamous same partner x 20 years No urinary symptoms This has not happened before No changes to soap, detergent, other lifestyle changes. No systemic symptoms.  Past Medical History:  Diagnosis Date  . Arthritis   . Depression   . Peptic ulcer   . Seasonal allergies     Past Surgical History:  Procedure Laterality Date  . CESAREAN SECTION  1986, 1988, 1991  . CHOLECYSTECTOMY    . TUBAL LIGATION  1991    Family History  Problem Relation Age of Onset  . Diabetes Mother   . Diabetes Father   . Heart disease Father     Social History   Socioeconomic History  . Marital status: Legally Separated    Spouse name: Not on file  . Number of children: Not on file  . Years of education: Not on file  . Highest education level: Not on file  Occupational History  . Not on file  Tobacco Use  . Smoking status: Current Every Day Smoker    Types: Cigarettes  . Smokeless tobacco: Never Used  . Tobacco comment: 2-3 cigs per day. Wants to quit. Will make appt for smoking cessation discussion.  Substance and Sexual Activity  . Alcohol use: No  . Drug use: No  . Sexual activity: Not on file  Other Topics Concern  . Not on file  Social History Narrative   Highest level of education middle school   Works full time at [employer], owns a car   Spanish speaking only   Does not exercise regularly   Smokes 2-3 cigarettes per day and wants to quit   Drinks alcohol monthly or less,  has 6 or more drinks at one time less than once a month   Feels secure in relationships.      (Above info from 07/21/2012 office visit)   Social Determinants of Health   Financial Resource Strain:   . Difficulty of Paying Living Expenses:   Food Insecurity:   . Worried About Programme researcher, broadcasting/film/video in the Last Year:   . Barista in the Last Year:   Transportation Needs:   . Freight forwarder (Medical):   Marland Kitchen Lack of Transportation (Non-Medical):   Physical Activity:   . Days of Exercise per Week:   . Minutes of Exercise per Session:   Stress:   . Feeling of Stress :   Social Connections:   . Frequency of Communication with Friends and Family:   . Frequency of Social Gatherings with Friends and Family:   . Attends Religious Services:   . Active Member of Clubs or Organizations:   . Attends Banker Meetings:   Marland Kitchen Marital Status:   Intimate Partner Violence:   . Fear of Current or Ex-Partner:   . Emotionally Abused:   Marland Kitchen Physically Abused:   . Sexually Abused:     Outpatient Medications Prior to Visit  Medication Sig Dispense Refill  . Elastic Bandages & Supports (WRIST SPLINT/COCK-UP/LEFT M) MISC Wear nightly (Patient not taking: Reported on 01/01/2020) 1 each 0  . Elastic Bandages & Supports (WRIST SPLINT/COCK-UP/RIGHT M) MISC Wear nightly (Patient not taking: Reported on 01/01/2020) 1 each 0  . FLUoxetine (PROZAC) 40 MG capsule Take 1 capsule (40 mg total) by mouth daily. (Patient not taking: Reported on 01/01/2020) 90 capsule 3  . oxybutynin (DITROPAN XL) 5 MG 24 hr tablet Take 1 tablet (5 mg total) by mouth at bedtime. (Patient not taking: Reported on 01/01/2020) 30 tablet 1   No facility-administered medications prior to visit.    No Known Allergies  Review of Systems  Constitutional: Negative.   HENT: Negative.   Eyes: Negative.   Respiratory: Negative.   Cardiovascular: Negative.   Gastrointestinal: Negative.   Endocrine: Negative.    Genitourinary: Positive for vaginal discharge. Negative for decreased urine volume, difficulty urinating, dyspareunia, dysuria, enuresis, flank pain, frequency, genital sores, hematuria, menstrual problem, pelvic pain, urgency, vaginal bleeding and vaginal pain.  Musculoskeletal: Negative.   Skin: Negative.   Allergic/Immunologic: Negative.   Neurological: Negative.   Hematological: Negative.   Psychiatric/Behavioral: Negative.   All other systems reviewed and are negative.      Objective:    Physical Exam Vitals and nursing note reviewed.  Constitutional:      General: She is not in acute distress.    Appearance: Normal appearance. She is normal weight. She is not ill-appearing, toxic-appearing or diaphoretic.  Cardiovascular:     Rate and Rhythm: Normal rate and regular rhythm.  Pulmonary:     Effort: Pulmonary effort is normal. No respiratory distress.  Skin:    Capillary Refill: Capillary refill takes less than 2 seconds.  Neurological:     General: No focal deficit present.     Mental Status: She is alert and oriented to person, place, and time. Mental status is at baseline.  Psychiatric:        Mood and Affect: Mood normal.        Behavior: Behavior normal.        Thought Content: Thought content normal.        Judgment: Judgment normal.     BP 107/79   Pulse 71   Temp 97.6 F (36.4 C) (Temporal)   Ht 5' (1.524 m)   Wt 175 lb 9.6 oz (79.7 kg)   LMP  (LMP Unknown)   SpO2 96%   BMI 34.29 kg/m  Wt Readings from Last 3 Encounters:  01/01/20 175 lb 9.6 oz (79.7 kg)  02/06/18 155 lb 12.8 oz (70.7 kg)  08/14/17 145 lb (65.8 kg)    Health Maintenance Due  Topic Date Due  . MAMMOGRAM  12/28/2013    There are no preventive care reminders to display for this patient.   Lab Results  Component Value Date   TSH 2.800 04/28/2017   Lab Results  Component Value Date   WBC 6.9 08/14/2017   HGB 13.8 08/14/2017   HCT 39.7 08/14/2017   MCV 89.0 08/14/2017   PLT  284 08/14/2017   Lab Results  Component Value Date   NA 137 08/14/2017   K 3.6 08/14/2017   CO2 20 (L) 08/14/2017   GLUCOSE 108 (H) 08/14/2017   BUN 12 08/14/2017   CREATININE 0.60 08/14/2017   BILITOT 0.4 04/02/2016   ALKPHOS 38 04/02/2016   AST 25 04/02/2016   ALT 14 04/02/2016   PROT 7.2 04/02/2016   ALBUMIN 4.3  04/02/2016   CALCIUM 8.9 08/14/2017   ANIONGAP 9 08/14/2017   Lab Results  Component Value Date   CHOL 220 (H) 04/02/2016   Lab Results  Component Value Date   HDL 48 04/02/2016   Lab Results  Component Value Date   LDLCALC 145 (H) 04/02/2016   Lab Results  Component Value Date   TRIG 137 04/02/2016   Lab Results  Component Value Date   CHOLHDL 4.6 04/02/2016   Lab Results  Component Value Date   HGBA1C 5.4 03/31/2016       Assessment & Plan:   Problem List Items Addressed This Visit    None    Visit Diagnoses    Vaginal burning    -  Primary   Relevant Orders   POCT Wet + KOH Prep (Completed)   Bacterial vaginosis       Relevant Medications   fluconazole (DIFLUCAN) 150 MG tablet   metroNIDAZOLE (FLAGYL) 500 MG tablet   Candidiasis, vagina       Relevant Medications   fluconazole (DIFLUCAN) 150 MG tablet   metroNIDAZOLE (FLAGYL) 500 MG tablet       Meds ordered this encounter  Medications  . fluconazole (DIFLUCAN) 150 MG tablet    Sig: Take 1 tablet (150 mg total) by mouth once for 1 dose.    Dispense:  1 tablet    Refill:  0    Order Specific Question:   Supervising Provider    Answer:   Delia Chimes A [id number]  . metroNIDAZOLE (FLAGYL) 500 MG tablet    Sig: Take 1 tablet (500 mg total) by mouth 3 (three) times daily.    Dispense:  21 tablet    Refill:  0    Order Specific Question:   Supervising Provider    Answer:   Delia Chimes A [id number]   PLAN  Wet prep shows bacteria and epithelial cells.   Metronidazole 500mg  PO tid for 7 days  Fluconazole should symptoms of candidal infection arise  Return PRN   Reassured patient that this is not a direct indicator of T2DM or infidelity in her relationship - though I would recommend routine health screening annually   Patient encouraged to call clinic with any questions, comments, or concerns.   Maximiano Coss, NP

## 2020-01-01 NOTE — Progress Notes (Signed)
se

## 2020-03-14 ENCOUNTER — Encounter: Payer: Self-pay | Admitting: Registered Nurse

## 2020-03-14 ENCOUNTER — Ambulatory Visit (INDEPENDENT_AMBULATORY_CARE_PROVIDER_SITE_OTHER): Payer: Self-pay | Admitting: Registered Nurse

## 2020-03-14 ENCOUNTER — Other Ambulatory Visit: Payer: Self-pay

## 2020-03-14 VITALS — BP 111/78 | HR 89 | Temp 98.1°F | Resp 17 | Ht 60.0 in | Wt 170.0 lb

## 2020-03-14 DIAGNOSIS — R300 Dysuria: Secondary | ICD-10-CM

## 2020-03-14 DIAGNOSIS — N760 Acute vaginitis: Secondary | ICD-10-CM

## 2020-03-14 DIAGNOSIS — N750 Cyst of Bartholin's gland: Secondary | ICD-10-CM

## 2020-03-14 DIAGNOSIS — B9689 Other specified bacterial agents as the cause of diseases classified elsewhere: Secondary | ICD-10-CM

## 2020-03-14 DIAGNOSIS — N898 Other specified noninflammatory disorders of vagina: Secondary | ICD-10-CM

## 2020-03-14 LAB — POCT URINALYSIS DIP (CLINITEK)
Bilirubin, UA: NEGATIVE
Glucose, UA: NEGATIVE mg/dL
Ketones, POC UA: NEGATIVE mg/dL
Leukocytes, UA: NEGATIVE
Nitrite, UA: NEGATIVE
POC PROTEIN,UA: NEGATIVE
Spec Grav, UA: >=1.03 — AB (ref 1.010–1.025)
Urobilinogen, UA: 0.2 E.U./dL
pH, UA: 5.5 (ref 5.0–8.0)

## 2020-03-14 LAB — POCT WET + KOH PREP
Trich by wet prep: ABSENT
Yeast by KOH: ABSENT
Yeast by wet prep: ABSENT

## 2020-03-14 MED ORDER — FLUCONAZOLE 150 MG PO TABS: 150.0000 mg | ORAL_TABLET | Freq: Once | ORAL | 0 refills | Status: AC

## 2020-03-14 MED ORDER — METRONIDAZOLE 500 MG PO TABS: 500.0000 mg | ORAL_TABLET | Freq: Two times a day (BID) | ORAL | 0 refills | Status: DC

## 2020-03-14 MED ORDER — TRAMADOL HCL 50 MG PO TABS: 50.0000 mg | ORAL_TABLET | Freq: Three times a day (TID) | ORAL | 0 refills | Status: AC | PRN

## 2020-03-14 NOTE — Patient Instructions (Signed)
° ° ° °  If you have lab work done today you will be contacted with your lab results within the next 2 weeks.  If you have not heard from us then please contact us. The fastest way to get your results is to register for My Chart. ° ° °IF you received an x-ray today, you will receive an invoice from [facility]. Please contact [facility] at [phone] with questions or concerns regarding your invoice.  ° °IF you received labwork today, you will receive an invoice from [facility]. Please contact [facility] at [phone] with questions or concerns regarding your invoice.  ° °Our billing staff will not be able to assist you with questions regarding bills from these companies. ° °You will be contacted with the lab results as soon as they are available. The fastest way to get your results is to activate your My Chart account. Instructions are located on the last page of this paperwork. If you have not heard from us regarding the results in 2 weeks, please contact this office. °  ° ° ° °

## 2020-03-17 ENCOUNTER — Encounter: Payer: Self-pay | Admitting: Registered Nurse

## 2020-03-17 NOTE — Progress Notes (Signed)
Acute Office Visit  Subjective:    Patient ID: Rita Sanchez, female    DOB: [birth year], 49 y.o.   MRN: [id number]  Chief Complaint  Patient presents with  . Vaginal Itching    patient states she has some itching and burning for the last three days burning feels like chili peppers . vaginal area is sensitive she states uses dove any kind she can find for bathing. She used some vinegar for vaginal area itching went away,    HPI Patient is in today for vaginal irritation.  Itching and burning onset 3 days prior. Worsening. Has had vaginal inf before, but no abx use within 1 mo. She has not changed soap or hygiene products. She does report using some vinegar to rinse her genitals and it resolved the itching but worsened the burning.  No risk for STI per pt. Long term monagamous relationship. No discrete rash, no discharge or odor. No pelvic pain or AUB.  Pt declines exam today - not comfortable with female provider, female provider unavailable at this time.  Past Medical History:  Diagnosis Date  . Arthritis   . Depression   . Peptic ulcer   . Seasonal allergies     Past Surgical History:  Procedure Laterality Date  . CESAREAN SECTION  1986, 1988, 1991  . CHOLECYSTECTOMY    . TUBAL LIGATION  1991    Family History  Problem Relation Age of Onset  . Diabetes Mother   . Diabetes Father   . Heart disease Father     Social History   Socioeconomic History  . Marital status: Legally Separated    Spouse name: Not on file  . Number of children: Not on file  . Years of education: Not on file  . Highest education level: Not on file  Occupational History  . Not on file  Tobacco Use  . Smoking status: Current Every Day Smoker    Types: Cigarettes  . Smokeless tobacco: Never Used  . Tobacco comment: 2-3 cigs per day. Wants to quit. Will make appt for smoking cessation discussion.  Substance and Sexual Activity  . Alcohol use: No  . Drug use: No  . Sexual activity:  Not on file  Other Topics Concern  . Not on file  Social History Narrative   Highest level of education middle school   Works full time at [employer], owns a car   Spanish speaking only   Does not exercise regularly   Smokes 2-3 cigarettes per day and wants to quit   Drinks alcohol monthly or less, has 6 or more drinks at one time less than once a month   Feels secure in relationships.      (Above info from 07/21/2012 office visit)   Social Determinants of Health   Financial Resource Strain:   . Difficulty of Paying Living Expenses:   Food Insecurity:   . Worried About Programme researcher, broadcasting/film/video in the Last Year:   . Barista in the Last Year:   Transportation Needs:   . Freight forwarder (Medical):   Marland Kitchen Lack of Transportation (Non-Medical):   Physical Activity:   . Days of Exercise per Week:   . Minutes of Exercise per Session:   Stress:   . Feeling of Stress :   Social Connections:   . Frequency of Communication with Friends and Family:   . Frequency of Social Gatherings with Friends and Family:   . Attends Religious Services:   .  Active Member of Clubs or Organizations:   . Attends Banker Meetings:   Marland Kitchen Marital Status:   Intimate Partner Violence:   . Fear of Current or Ex-Partner:   . Emotionally Abused:   Marland Kitchen Physically Abused:   . Sexually Abused:     Outpatient Medications Prior to Visit  Medication Sig Dispense Refill  . Elastic Bandages & Supports (WRIST SPLINT/COCK-UP/LEFT M) MISC Wear nightly (Patient not taking: Reported on 01/01/2020) 1 each 0  . Elastic Bandages & Supports (WRIST SPLINT/COCK-UP/RIGHT M) MISC Wear nightly (Patient not taking: Reported on 01/01/2020) 1 each 0  . FLUoxetine (PROZAC) 40 MG capsule Take 1 capsule (40 mg total) by mouth daily. (Patient not taking: Reported on 01/01/2020) 90 capsule 3  . oxybutynin (DITROPAN XL) 5 MG 24 hr tablet Take 1 tablet (5 mg total) by mouth at bedtime. (Patient not taking: Reported on  01/01/2020) 30 tablet 1  . metroNIDAZOLE (FLAGYL) 500 MG tablet Take 1 tablet (500 mg total) by mouth 3 (three) times daily. (Patient not taking: Reported on 03/14/2020) 21 tablet 0   No facility-administered medications prior to visit.    No Known Allergies  Review of Systems  Constitutional: Negative.   HENT: Negative.   Eyes: Negative.   Respiratory: Negative.   Cardiovascular: Negative.   Gastrointestinal: Negative.   Endocrine: Negative.   Genitourinary: Positive for vaginal pain. Negative for decreased urine volume, difficulty urinating, dyspareunia, dysuria, enuresis, flank pain, frequency, genital sores, hematuria, menstrual problem, pelvic pain, urgency, vaginal bleeding and vaginal discharge.  Musculoskeletal: Negative.   Skin: Negative.   Allergic/Immunologic: Negative.   Neurological: Negative.   Hematological: Negative.   Psychiatric/Behavioral: Negative.   All other systems reviewed and are negative.      Objective:    Physical Exam Vitals and nursing note reviewed.  Constitutional:      General: She is not in acute distress.    Appearance: Normal appearance. She is obese. She is not ill-appearing, toxic-appearing or diaphoretic.  Cardiovascular:     Rate and Rhythm: Normal rate and regular rhythm.  Pulmonary:     Effort: Pulmonary effort is normal. No respiratory distress.  Genitourinary:    Comments: Exam deferred per pt comfort level. Skin:    Capillary Refill: Capillary refill takes less than 2 seconds.  Neurological:     General: No focal deficit present.     Mental Status: She is alert and oriented to person, place, and time. Mental status is at baseline.     Cranial Nerves: No cranial nerve deficit.     BP 111/78   Pulse 89   Temp 98.1 F (36.7 C) (Temporal)   Resp 17   Ht 5' (1.524 m)   Wt 170 lb (77.1 kg)   SpO2 94%   BMI 33.20 kg/m  Wt Readings from Last 3 Encounters:  03/14/20 170 lb (77.1 kg)  01/01/20 175 lb 9.6 oz (79.7 kg)    02/06/18 155 lb 12.8 oz (70.7 kg)    There are no preventive care reminders to display for this patient.  There are no preventive care reminders to display for this patient.   Lab Results  Component Value Date   TSH 2.800 04/28/2017   Lab Results  Component Value Date   WBC 6.9 08/14/2017   HGB 13.8 08/14/2017   HCT 39.7 08/14/2017   MCV 89.0 08/14/2017   PLT 284 08/14/2017   Lab Results  Component Value Date   NA 137 08/14/2017   K  3.6 08/14/2017   CO2 20 (L) 08/14/2017   GLUCOSE 108 (H) 08/14/2017   BUN 12 08/14/2017   CREATININE 0.60 08/14/2017   BILITOT 0.4 04/02/2016   ALKPHOS 38 04/02/2016   AST 25 04/02/2016   ALT 14 04/02/2016   PROT 7.2 04/02/2016   ALBUMIN 4.3 04/02/2016   CALCIUM 8.9 08/14/2017   ANIONGAP 9 08/14/2017   Lab Results  Component Value Date   CHOL 220 (H) 04/02/2016   Lab Results  Component Value Date   HDL 48 04/02/2016   Lab Results  Component Value Date   LDLCALC 145 (H) 04/02/2016   Lab Results  Component Value Date   TRIG 137 04/02/2016   Lab Results  Component Value Date   CHOLHDL 4.6 04/02/2016   Lab Results  Component Value Date   HGBA1C 5.4 03/31/2016       Assessment & Plan:   Problem List Items Addressed This Visit    None    Visit Diagnoses    Vaginal irritation    -  Primary   Relevant Medications   metroNIDAZOLE (FLAGYL) 500 MG tablet   Other Relevant Orders   POCT wet + KOH prep (Completed)   Dysuria       Relevant Orders   POCT URINALYSIS DIP (CLINITEK) (Completed)   Bacterial vaginosis       Relevant Medications   metroNIDAZOLE (FLAGYL) 500 MG tablet   Bartholin gland cyst       Relevant Medications   traMADol (ULTRAM) 50 MG tablet   Other Relevant Orders   Ambulatory referral to Gynecology       Meds ordered this encounter  Medications  . metroNIDAZOLE (FLAGYL) 500 MG tablet    Sig: Take 1 tablet (500 mg total) by mouth 2 (two) times daily.    Dispense:  14 tablet    Refill:  0     Order Specific Question:   Supervising Provider    Answer:   Delia Chimes A [id number]  . fluconazole (DIFLUCAN) 150 MG tablet    Sig: Take 1 tablet (150 mg total) by mouth once for 1 dose.    Dispense:  1 tablet    Refill:  0    Order Specific Question:   Supervising Provider    Answer:   Delia Chimes A [id number]  . traMADol (ULTRAM) 50 MG tablet    Sig: Take 1 tablet (50 mg total) by mouth every 8 (eight) hours as needed for up to 5 days.    Dispense:  15 tablet    Refill:  0    Order Specific Question:   Supervising Provider    Answer:   Forrest Moron [id number]   PLAN  Flagyl 500mg  PO bid for 7 days  Diflucan 150mg  PO once  Tramadol for breakthrough pain  Nothing to wash except warm water  Return for female provider if symptoms persist. Will also give referral to gyn group  Patient encouraged to call clinic with any questions, comments, or concerns.   Maximiano Coss, NP

## 2020-04-24 ENCOUNTER — Other Ambulatory Visit: Payer: Self-pay

## 2020-04-24 ENCOUNTER — Ambulatory Visit (INDEPENDENT_AMBULATORY_CARE_PROVIDER_SITE_OTHER): Payer: Self-pay | Admitting: Family Medicine

## 2020-04-24 ENCOUNTER — Encounter: Payer: Self-pay | Admitting: Family Medicine

## 2020-04-24 VITALS — BP 118/80 | HR 66 | Temp 97.9°F | Ht 59.0 in | Wt 167.6 lb

## 2020-04-24 DIAGNOSIS — N898 Other specified noninflammatory disorders of vagina: Secondary | ICD-10-CM

## 2020-04-24 DIAGNOSIS — R300 Dysuria: Secondary | ICD-10-CM

## 2020-04-24 DIAGNOSIS — N760 Acute vaginitis: Secondary | ICD-10-CM

## 2020-04-24 DIAGNOSIS — R21 Rash and other nonspecific skin eruption: Secondary | ICD-10-CM

## 2020-04-24 DIAGNOSIS — N951 Menopausal and female climacteric states: Secondary | ICD-10-CM

## 2020-04-24 DIAGNOSIS — B9689 Other specified bacterial agents as the cause of diseases classified elsewhere: Secondary | ICD-10-CM

## 2020-04-24 DIAGNOSIS — N949 Unspecified condition associated with female genital organs and menstrual cycle: Secondary | ICD-10-CM

## 2020-04-24 LAB — POCT URINALYSIS DIP (MANUAL ENTRY)
Bilirubin, UA: NEGATIVE
Blood, UA: NEGATIVE
Glucose, UA: NEGATIVE mg/dL
Ketones, POC UA: NEGATIVE mg/dL
Leukocytes, UA: NEGATIVE
Nitrite, UA: NEGATIVE
Protein Ur, POC: NEGATIVE mg/dL
Spec Grav, UA: >=1.03 — AB (ref 1.010–1.025)
Urobilinogen, UA: 0.2 E.U./dL
pH, UA: 5 (ref 5.0–8.0)

## 2020-04-24 LAB — POCT WET + KOH PREP
Trich by wet prep: ABSENT
Yeast by KOH: ABSENT
Yeast by wet prep: ABSENT

## 2020-04-24 LAB — GLUCOSE, POCT (MANUAL RESULT ENTRY): POC Glucose: 109 mg/dl — AB (ref 70–99)

## 2020-04-24 LAB — POCT SKIN KOH: Skin KOH, POC: NEGATIVE

## 2020-04-24 MED ORDER — TRIAMCINOLONE ACETONIDE 0.1 % EX CREA: TOPICAL_CREAM | CUTANEOUS | 1 refills | Status: AC

## 2020-04-24 MED ORDER — METRONIDAZOLE 500 MG PO TABS: 500.0000 mg | ORAL_TABLET | Freq: Two times a day (BID) | ORAL | 0 refills | Status: DC

## 2020-04-24 NOTE — Patient Instructions (Addendum)
Your blood sugar is good and does not indicate evidence for diabetes.  You do still have some evidence of bacterial vaginosis, and I am treating you for that again.  If it does not seem to improve you can ask the gynecologist about it.  Referral is being made to a gynecologist to help you manage her menopausal symptoms.  If you do not hear from the referral in the next couple of weeks contact the referrals desk at the office to find out what happened to it.  Your main problem seems to be the rash around your bottom.  I suspect this is allergic.  Use of the cream 2 times daily a small amount to relieve the itching.  As we discussed, wear cotton underwear and try to rinse the underwear well and use a low allergy soap such as Dreft.  Also rinse yourself off very well after showers.  Return as needed.   Vaginosis bacteriana Bacterial Vaginosis  La vaginosis bacteriana es una infeccin de la vagina. Se produce cuando crece una cantidad excesiva de grmenes normales (bacterias sanas) en la vagina. Esta infeccin aumenta el riesgo de contraer otras infecciones de transmisin sexual (ITS). El tratamiento de esta infeccin puede disminuir el riesgo de Marine scientistpadecer algunas ITS. Si est embarazada, tambin debe tratarse. Esto puede causar el nacimiento prematuro de su beb. Sigue estas indicaciones en tu casa: Medicamentos  Baxter Internationalome los medicamentos de venta libre y los recetados solamente como se lo haya indicado el mdico.  Mendotaome o use el antibitico como se lo haya indicado el mdico. No deje de tomarlo o usarlo aunque comience a sentirse mejor. Indicaciones generales  Si su pareja sexual es mujer, dgale que tiene esta infeccin. Si tiene sntomas, debe tratarse. Si tiene una pareja sexual hombre, l no necesita tratamiento.  Durante el tratamiento: ? Haematologistvite tener sexo. ? No se haga duchas vaginales. ? Evite la ingesta de alcohol como se lo hayan indicado. ? Evite la lactancia como se lo hayan  indicado.  Beba suficiente lquido como para mantener el pis (orina) claro o de color amarillo plido.  Mantenga su vagina y ano (recto) limpios. ? Lave la zona diariamente con agua tibia. ? Cuando vaya al bao, higiencese de adelante hacia atrs.  Concurra a todas las visitas de [address] Parkwayseguimiento como se lo haya indicado el mdico. Esto es importante. Para evitar esta afeccin  No se haga duchas vaginales.  Use nicamente agua tibia para lavar la zona alrededor de la vagina.  Use proteccin al News Corporationtener relaciones sexuales. Esto puede comprender lo siguiente: ? Preservativos de ltex. ? Barrera bucal.  Limite la cantidad de personas con las que tiene sexo. Es Psychologist, prison and probation servicesmejor tener relaciones sexuales con la misma persona (ser mongamo).  Hgase exmenes de ITS. Su pareja deber realizarse anlisis de deteccin.  Use ropa interior de algodn o cuya parte de adentro sea de algodn.  Evite los pantalones ajustados y las medias tipo pantis. Esto es particularmente importante durante el verano.  No consuma ningn producto que contenga nicotina o tabaco. Estos incluyen los cigarrillos y los cigarrillos electrnicos. Si necesita ayuda para dejar de fumar, consulte al mdico.  No consuma drogas ilegales.  Limite la cantidad de alcohol que consume. Comunquese con un mdico si:  Los sntomas no mejoran, incluso despus de Medical illustratorrealizar el tratamiento.  Tiene ms secrecin o siente dolor al hacer pis (orinar).  Grant RutsFiebre.  Siente dolor en el vientre (abdomen).  Siente dolor durante el sexo.  Le sangra la vagina entre perodos  menstruales. Resumen  Esta infeccin se produce cuando una cantidad excesiva de grmenes (bacterias) crece en la vagina.  El tratamiento de esta afeccin puede disminuir el riesgo de padecer algunas infecciones de transmisin sexual (ITS).  Si est embarazada, tambin debe tratarse. Puede causar nacimientos prematuros.  No deje de tomar ni de [employer] antibiticos aunque  comience a Actor. Esta informacin no tiene Theme park manager el consejo del mdico. Asegrese de hacerle al mdico cualquier pregunta que tenga. Document Revised: 06/05/2018 Document Reviewed: 06/05/2018 Elsevier Patient Education  2020 [employer].    La menopausia Menopause La menopausia es el perodo normal de la vida en el que los perodos menstruales cesan por completo. Por lo general, se confirma tras de no haber tenido un perodo menstrual. La transicin a la menopausia (perimenopausia), con mayor frecuencia, ocurre entre los 45 y los 55aos. Durante la perimenopausia, los niveles hormonales cambian en el [place], lo que puede provocar sntomas y Ship broker. La menopausia podra aumentar el riesgo de sufrir lo siguiente:  Prdida de masa sea (osteoporosis), lo que predispone a la rotura de huesos (fracturas).  Depresin.  Endurecimiento y Scientist, research (medical) de las arterias (aterosclerosis), lo que puede causar infartos de miocardio y accidentes cerebrovasculares. Cules son las causas? A menudo, la causa de esta afeccin es un cambio natural en los niveles hormonales, que ocurre a medida que envejece. La afeccin tambin podra ser provocada por una ciruga en la que se extraigan ambos ovarios (ooforectoma bilateral). Qu incrementa el riesgo? Es ms probable que esta afeccin comience a una temprana edad si tiene ciertas afecciones o se realiza ciertos tratamientos, incluidos los siguientes:  Un tumor en la hipfisis del cerebro.  Una enfermedad que afecte los ovarios y la produccin de hormonas.  Radioterapia para tratar Management consultant.  Ciertos tratamientos contra el cncer, como quimioterapia o terapia hormonal (antiestrgeno).  Fumar mucho y consumir alcohol de forma excesiva.  Antecedentes familiares de menopausia temprana. Adems, es ms probable que esta afeccin se presente de manera temprana en las mujeres que son [place]. Cules son  los signos o los sntomas? Los sntomas de esta afeccin incluyen los siguientes:  Engineer, maintenance.  Perodos menstruales irregulares.  Sudoracin nocturna.  Cambios en el sentimiento respecto de las [employer]. Es posible que el deseo sexual disminuya o que se sienta ms cmoda respecto de su sexualidad.  Sequedad vaginal y adelgazamiento de las paredes de la vagina. Esto podra hacer que sienta dolor durante las relaciones sexuales.  Sequedad de la piel y aparicin de Banker.  Dolores de [place].  Problemas para dormir (insomnio).  Cambios de humor o irritabilidad.  Problemas de memoria.  Aumento de [place].  Crecimiento de bello en la cara y el pecho.  Infecciones en la vejiga o problemas para orinar. Cmo se diagnostica? Esta afeccin se diagnostica en funcin de los antecedentes mdicos, un examen fsico, la edad, los antecedentes menstruales y los sntomas. Tambin le podran realizar estudios hormonales. Cmo se trata? En algunos los casos, no se necesita tratamiento. Usted y el mdico deben decidir juntos si se debe Pensions consultant. El tratamiento se determinar en funcin de su cuadro clnico y de sus preferencias. El tratamiento de este cuadro clnico se centra en el control de los sntomas. El tratamiento puede incluir lo siguiente:  Terapia hormonal para la menopausia.  Medicamentos para tratar sntomas o complicaciones especficos.  Acupuntura.  Vitaminas o suplementos herbales. Antes de Microbiologist, es importante que le avise al  mdico si tiene antecedentes personales o familiares de lo siguiente:  Enfermedades cardacas.  Cncer de mama.  Cogulos de [place].  Diabetes.  Osteoporosis. Siga estas indicaciones en su casa: Estilo de vida  No consuma ningn producto que contenga nicotina o tabaco, como cigarrillos y Administrator, Civil Service. Si necesita ayuda para dejar de fumar, consulte al mdico.  Realice, por lo menos,  de actividad fsica 5das por semana o ms.  Evite las bebidas con alcohol o cafena, as como las [employer]. Esto podra ayudar a prevenir los acaloramientos.  Intente dormir de 7 a 8horas todas las noches.  Si tiene acaloramientos: ? Vstase en capas. ? Evite las cosas que podran [employer] acaloramientos, como las comidas muy condimentadas, los lugares calientes o el estrs. ? Respire profundamente y despacio cuando comience a Scientist, water quality. ? Tenga un ventilador en su casa y en su oficina.  Encuentre modos de [employer], por ejemplo, a travs de la respiracin, meditacin o un diario ntimo.  Considere la posibilidad de asistir a terapia grupal con otras mujeres que tengan sntomas de [place]. Pdale recomendaciones al [employer] reuniones de terapia grupal. Comida y bebida  Siga una dieta saludable y equilibrada que incluya cereales integrales, protenas magras, productos lcteos descremados y [place] frutas y verduras.  El mdico podra recomendarle que agregue una mayor cantidad de soja a su dieta. Algunos de los alimentos que contienen soja son el tofu, el tempeh y la [place] de soja.  Consuma muchos alimentos que contengan calcio y vitaminaD para Nutritional therapist salud sea. Algunos productos que contienen mucho calcio son los [employer], el yogur, los frijoles, las [place], las sardinas, el brcoli y la col rizada. Medicamentos  Baxter International de venta libre y los recetados solamente como se lo haya indicado el mdico.  Hable con el mdico antes de Corporate investment banker a tomar cualquier suplemento herbal. Tome las vitaminas y los suplementos recetados como se lo haya indicado el mdico. Estos pueden incluir lo siguiente: ? Calcio. Las mujeres de 51aos o ms deben consumir 1200mg  (miligramos) de . ? VitaminaD. Las mujeres necesitan entre 600 y 800unidades internacionales de vitaminaD todos [place]. ? VitaminasB12 yB6. Procure consumir [place] de vitaminaB12 y 1,5mg  de . Instrucciones generales  Lleve un registro de sus perodos menstruales; incluya lo siguiente: ? El momento en que ocurren. ? Qu tan abundantes son y cunto duran. ? Cunto tiempo transcurre entre cada perodo menstrual.  Lleve un registro de los sntomas; anote cundo comienzan, con qu frecuencia ocurren y cunto duran.  Use lubricantes o humectantes vaginales para aliviar la sequedad vaginal y [employer] durante las relaciones sexuales.  Concurra a todas las visitas de control como se lo haya indicado el mdico. Esto es importante. Esto incluye la terapia grupal y la psicoterapia. Comunquese con un mdico si:  An tiene perodos menstruales despus de los 55aos.  Siente dolor durante las Personnel officer.  No tuvo perodos menstruales durante los ltimos [employer] y presenta sangrado vaginal. Solicite ayuda de inmediato si:  Tiene los siguientes sntomas: ? Depresin grave. ? Sangrado vaginal excesivo. ? Dolor al . ? Latidos cardacos rpidos o irregulares (palpitaciones). ? Dolor de cabeza intenso. ? Dolor en el abdomen (abdominal) o dispepsia grave.  Se cay y cree que se ha fracturado un hueso.  Siente dolor en el pecho o en la pierna.  Desarrolla problemas de visin.  Se toca un bulto en la mama. Resumen  La menopausia es el perodo normal de la vida en el que los perodos menstruales cesan por completo. Por lo general, se confirma tras de no haber tenido un perodo menstrual.  La transicin a la menopausia (perimenopausia), con mayor frecuencia, ocurre entre los 45 y los 55aos.  Los sntomas pueden controlarse mediante medicamentos, cambios en el estilo de vida y terapias complementarias, como acupuntura.  Siga una dieta equilibrada que incluya muchos nutrientes para Biochemist, clinical salud sea y la salud cardaca, y para  Chief Operating Officer los sntomas durante la [place]. Esta informacin no tiene Theme park manager el consejo del mdico. Asegrese de hacerle al mdico cualquier pregunta que tenga. Document Revised: 06/30/2017 Document Reviewed: 06/30/2017 Elsevier Patient Education  The [employer].  If you have lab work done today you will be contacted with your lab results within the next 2 weeks.  If you have not heard from [place] then please contact us. The fastest way to get your results is to register for My Chart.   IF you received an x-ray today, you will receive an invoice from [facility]. Please contact [facility] at [phone] with questions or concerns regarding your invoice.   IF you received labwork today, you will receive an invoice from [place]. Please contact [facility] at [phone] with questions or concerns regarding your invoice.   Our billing staff will not be able to assist you with questions regarding bills from these companies.  You will be contacted with the lab results as soon as they are available. The fastest way to get your results is to activate your My Chart account. Instructions are located on the last page of this paperwork. If you have not heard from [place] regarding the results in 2 weeks, please contact this office.

## 2020-04-24 NOTE — Progress Notes (Signed)
Patient ID: Rita Sanchez, female    DOB: 05/07/1971  Age: 49 y.o. MRN: [id number]  Chief Complaint  Patient presents with  . Vaginal Itching    Pt stated that she has been having some burning/itching in the vaginal area and she is peeling in the area that goes all the way to the back area for the past 3 mos.    Subjective:  Rita Sanchez is a 49 year old Hispanic lady who only speaks limited English so we use the computer on line interpreter.  She has been here several times with vaginal symptoms.  She has had treatment for BV.  She continues to complain of itching.  On further questioning it sounds like she has more itching around her buttocks and medial thighs and actually in the vaginal area.  Occasionally when she has been scratching a lot she gets a little bleeding around the rectum.  She is not diabetic that she knows of, but her mother told her she got these kind of symptoms when she was becoming diabetic.  Current allergies, medications, problem list, past/family and social histories reviewed.  Objective:  BP 118/80   Pulse 66   Temp 97.9 F (36.6 C) (Temporal)   Ht 4\' 11"  (1.499 m)   Wt 167 lb 9.6 oz (76 kg)   LMP  (LMP Unknown)   SpO2 97%   BMI 33.85 kg/m   Pleasant lady in no acute distress.  She is overweight.  She has a healing little abscessed area on her left low abdominal wall in the pannus.  That does not seem to be an issue.  The vaginal introitus looks okay, but just below it close to the perineum the medial thigh areas have flaking skin and and have been excoriated some.  This extends all around to about the level of the anus.  Actually the skin around the anus itself looks okay, and it is a little further out that the skin is dry and flaky with some excoriated areas.  Assessment & Plan:   Assessment: 1. Dysuria   2. Vaginal itching   3. Vaginal burning   4. Rash and nonspecific skin eruption   5. Menopausal sweats   6. BV (bacterial vaginosis)        Plan: Will check her blood sugar vaginal smear, urinalysis, and skin scraping to see if we can determine what is going on with her.  It appears she is not diabetic and does not have a UTI but does have clue cells consistent with bacterial vaginosis.  I do not think that is the cause of the external rash.  This rash could be allergic from soap or clothing.  She does have bacterial vaginosis but no think it is causing the rash.  Orders Placed This Encounter  Procedures  . Ambulatory referral to Gynecology    Referral Priority:   Routine    Referral Type:   Consultation    Referral Reason:   Specialty Services Required    Requested Specialty:   Gynecology    Number of Visits Requested:   1  . POCT urinalysis dipstick  . POCT Wet + KOH Prep  . POCT glucose (manual entry)  . POCT Skin KOH    Meds ordered this encounter  Medications  . triamcinolone cream (KENALOG) 0.1 %    Sig: Apply a small amount of cream 2 times daily to the rash and itching area.    Dispense:  45 g    Refill:  1  .  metroNIDAZOLE (FLAGYL) 500 MG tablet    Sig: Take 1 tablet (500 mg total) by mouth 2 (two) times daily with a meal. DO NOT CONSUME ALCOHOL WHILE TAKING THIS MEDICATION.    Dispense:  14 tablet    Refill:  0         Patient Instructions    Your blood sugar is good and does not indicate evidence for diabetes.  You do still have some evidence of bacterial vaginosis, and I am treating you for that again.  If it does not seem to improve you can ask the gynecologist about it.  Referral is being made to a gynecologist to help you manage her menopausal symptoms.  If you do not hear from the referral in the next couple of weeks contact the referrals desk at the office to find out what happened to it.  Your main problem seems to be the rash around your bottom.  I suspect this is allergic.  Use of the cream 2 times daily a small amount to relieve the itching.  As we discussed, wear cotton  underwear and try to rinse the underwear well and use a low allergy soap such as Dreft.  Also rinse yourself off very well after showers.  Return as needed.   Vaginosis bacteriana Bacterial Vaginosis  La vaginosis bacteriana es una infeccin de la vagina. Se produce cuando crece una cantidad excesiva de grmenes normales (bacterias sanas) en la vagina. Esta infeccin aumenta el riesgo de contraer otras infecciones de transmisin sexual (ITS). El tratamiento de esta infeccin puede disminuir el riesgo de Marine scientist algunas ITS. Si est embarazada, tambin debe tratarse. Esto puede causar el nacimiento prematuro de su beb. Sigue estas indicaciones en tu casa: Medicamentos  [employer] de venta libre y los recetados solamente como se lo haya indicado el mdico.  [place] o use el antibitico como se lo haya indicado el mdico. No deje de tomarlo o usarlo aunque comience a sentirse mejor. Indicaciones generales  Si su pareja sexual es mujer, dgale que tiene esta infeccin. Si tiene sntomas, debe tratarse. Si tiene una pareja sexual hombre, l no necesita tratamiento.  Durante el tratamiento: ? Haematologist. ? No se haga duchas vaginales. ? Evite la ingesta de alcohol como se lo hayan indicado. ? Evite la lactancia como se lo hayan indicado.  Beba suficiente lquido como para mantener el pis (orina) claro o de color amarillo plido.  Mantenga su vagina y ano (recto) limpios. ? Lave la zona diariamente con agua tibia. ? Cuando vaya al bao, higiencese de adelante hacia atrs.  Concurra a todas las visitas de [address] se lo haya indicado el mdico. Esto es importante. Para evitar esta afeccin  No se haga duchas vaginales.  Use nicamente agua tibia para lavar la zona alrededor de la vagina.  Use proteccin al [employer]. Esto puede comprender lo siguiente: ? Preservativos de ltex. ? Barrera bucal.  Limite la cantidad de personas con las que tiene  sexo. Es Psychologist, prison and probation services relaciones sexuales con la misma persona (ser mongamo).  Hgase exmenes de ITS. Su pareja deber realizarse anlisis de deteccin.  Use ropa interior de algodn o cuya parte de adentro sea de algodn.  Evite los pantalones ajustados y las medias tipo pantis. Esto es particularmente importante durante el verano.  No consuma ningn producto que contenga nicotina o tabaco. Estos incluyen los cigarrillos y los cigarrillos electrnicos. Si necesita ayuda para dejar de fumar, consulte al mdico.  No consuma  drogas ilegales.  Limite la cantidad de alcohol que consume. Comunquese con un mdico si:  Los sntomas no mejoran, incluso despus de Medical illustrator.  Tiene ms secrecin o siente dolor al hacer pis (orinar).  Grant Ruts.  Siente dolor en el vientre (abdomen).  Siente dolor durante el sexo.  Le sangra la vagina entre perodos menstruales. Resumen  Esta infeccin se produce cuando una cantidad excesiva de grmenes (bacterias) crece en la vagina.  El tratamiento de esta afeccin puede disminuir el riesgo de padecer algunas infecciones de transmisin sexual (ITS).  Si est embarazada, tambin debe tratarse. Puede causar nacimientos prematuros.  No deje de tomar ni de [employer] antibiticos aunque comience a Actor. Esta informacin no tiene Theme park manager el consejo del mdico. Asegrese de hacerle al mdico cualquier pregunta que tenga. Document Revised: 06/05/2018 Document Reviewed: 06/05/2018 Elsevier Patient Education  2020 [employer].    La menopausia Menopause La menopausia es el perodo normal de la vida en el que los perodos menstruales cesan por completo. Por lo general, se confirma tras de no haber tenido un perodo menstrual. La transicin a la menopausia (perimenopausia), con mayor frecuencia, ocurre entre los 45 y los 55aos. Durante la perimenopausia, los niveles hormonales cambian en el [place], lo que puede  provocar sntomas y Ship broker. La menopausia podra aumentar el riesgo de sufrir lo siguiente:  Prdida de masa sea (osteoporosis), lo que predispone a la rotura de huesos (fracturas).  Depresin.  Endurecimiento y Scientist, research (medical) de las arterias (aterosclerosis), lo que puede causar infartos de miocardio y accidentes cerebrovasculares. Cules son las causas? A menudo, la causa de esta afeccin es un cambio natural en los niveles hormonales, que ocurre a medida que envejece. La afeccin tambin podra ser provocada por una ciruga en la que se extraigan ambos ovarios (ooforectoma bilateral). Qu incrementa el riesgo? Es ms probable que esta afeccin comience a una temprana edad si tiene ciertas afecciones o se realiza ciertos tratamientos, incluidos los siguientes:  Un tumor en la hipfisis del cerebro.  Una enfermedad que afecte los ovarios y la produccin de hormonas.  Radioterapia para tratar Management consultant.  Ciertos tratamientos contra el cncer, como quimioterapia o terapia hormonal (antiestrgeno).  Fumar mucho y consumir alcohol de forma excesiva.  Antecedentes familiares de menopausia temprana. Adems, es ms probable que esta afeccin se presente de manera temprana en las mujeres que son [place]. Cules son los signos o los sntomas? Los sntomas de esta afeccin incluyen los siguientes:  Engineer, maintenance.  Perodos menstruales irregulares.  Sudoracin nocturna.  Cambios en el sentimiento respecto de las [employer]. Es posible que el deseo sexual disminuya o que se sienta ms cmoda respecto de su sexualidad.  Sequedad vaginal y adelgazamiento de las paredes de la vagina. Esto podra hacer que sienta dolor durante las relaciones sexuales.  Sequedad de la piel y aparicin de Banker.  Dolores de [place].  Problemas para dormir (insomnio).  Cambios de humor o irritabilidad.  Problemas de memoria.  Aumento de [place].  Crecimiento de bello en la  cara y el pecho.  Infecciones en la vejiga o problemas para orinar. Cmo se diagnostica? Esta afeccin se diagnostica en funcin de los antecedentes mdicos, un examen fsico, la edad, los antecedentes menstruales y los sntomas. Tambin le podran realizar estudios hormonales. Cmo se trata? En algunos los casos, no se necesita tratamiento. Usted y el mdico deben decidir juntos si se debe Pensions consultant. El tratamiento se determinar en funcin de  su cuadro clnico y de sus preferencias. El tratamiento de este cuadro clnico se centra en el control de los sntomas. El tratamiento puede incluir lo siguiente:  Terapia hormonal para la menopausia.  Medicamentos para tratar sntomas o complicaciones especficos.  Acupuntura.  Vitaminas o suplementos herbales. Antes de Microbiologist, es importante que le avise al mdico si tiene antecedentes personales o familiares de lo siguiente:  Enfermedades cardacas.  Cncer de mama.  Cogulos de [place].  Diabetes.  Osteoporosis. Siga estas indicaciones en su casa: Estilo de vida  No consuma ningn producto que contenga nicotina o tabaco, como cigarrillos y Administrator, Civil Service. Si necesita ayuda para dejar de fumar, consulte al mdico.  Realice, por lo menos, de actividad fsica 5das por semana o ms.  Evite las bebidas con alcohol o cafena, as como las [employer]. Esto podra ayudar a prevenir los acaloramientos.  Intente dormir de 7 a 8horas todas las noches.  Si tiene acaloramientos: ? Vstase en capas. ? Evite las cosas que podran [employer] acaloramientos, como las comidas muy condimentadas, los lugares calientes o el estrs. ? Respire profundamente y despacio cuando comience a Scientist, water quality. ? Tenga un ventilador en su casa y en su oficina.  Encuentre modos de [employer], por ejemplo, a travs de la respiracin, meditacin o un diario  ntimo.  Considere la posibilidad de asistir a terapia grupal con otras mujeres que tengan sntomas de [place]. Pdale recomendaciones al [employer] reuniones de terapia grupal. Comida y bebida  Siga una dieta saludable y equilibrada que incluya cereales integrales, protenas magras, productos lcteos descremados y [place] frutas y verduras.  El mdico podra recomendarle que agregue una mayor cantidad de soja a su dieta. Algunos de los alimentos que contienen soja son el tofu, el tempeh y la Stella de soja.  Consuma muchos alimentos que contengan calcio y vitaminaD para Nutritional therapist salud sea. Algunos productos que contienen mucho calcio son los [employer], el yogur, los frijoles, las [place], las sardinas, el brcoli y la col rizada. Medicamentos  [employer] de venta libre y los recetados solamente como se lo haya indicado el mdico.  Hable con el mdico antes de Corporate investment banker a tomar cualquier suplemento herbal. Tome las vitaminas y los suplementos recetados como se lo haya indicado el mdico. Estos pueden incluir lo siguiente: ? Calcio. Las mujeres de 51aos o ms deben consumir 1200mg  (miligramos) de . ? VitaminaD. Las mujeres necesitan entre 600 y 800unidades internacionales de vitaminaD todos [place]. ? VitaminasB12 yB6. Procure consumir [place] de vitaminaB12 y 1,5mg  de . Instrucciones generales  Lleve un registro de sus perodos menstruales; incluya lo siguiente: ? El momento en que ocurren. ? Qu tan abundantes son y cunto duran. ? Cunto tiempo transcurre entre cada perodo menstrual.  Lleve un registro de los sntomas; anote cundo comienzan, con qu frecuencia ocurren y cunto duran.  Use lubricantes o humectantes vaginales para aliviar la sequedad vaginal y [employer] durante las relaciones sexuales.  Concurra a todas las visitas de control como se lo haya indicado el mdico. Esto  es importante. Esto incluye la terapia grupal y la psicoterapia. Comunquese con un mdico si:  An tiene perodos menstruales despus de los 55aos.  Siente dolor durante las Personnel officer.  No tuvo perodos menstruales durante los ltimos [employer] y presenta sangrado vaginal. Solicite ayuda de inmediato si:  Tiene los siguientes sntomas: ? Depresin grave. ? Sangrado vaginal excesivo. ?  Dolor al Beatrix Shipper. ? Latidos cardacos rpidos o irregulares (palpitaciones). ? Dolor de cabeza intenso. ? Dolor en el abdomen (abdominal) o dispepsia grave.  Se cay y cree que se ha fracturado un hueso.  Siente dolor en el pecho o en la pierna.  Desarrolla problemas de visin.  Se toca un bulto en la mama. Resumen  La menopausia es el perodo normal de la vida en el que los perodos menstruales cesan por completo. Por lo general, se confirma tras de no haber tenido un perodo menstrual.  La transicin a la menopausia (perimenopausia), con mayor frecuencia, ocurre entre los 45 y los 55aos.  Los sntomas pueden controlarse mediante medicamentos, cambios en el estilo de vida y terapias complementarias, como acupuntura.  Siga una dieta equilibrada que incluya muchos nutrientes para Biochemist, clinical salud sea y la salud cardaca, y para Chief Operating Officer los sntomas durante la [place]. Esta informacin no tiene Theme park manager el consejo del mdico. Asegrese de hacerle al mdico cualquier pregunta que tenga. Document Revised: 06/30/2017 Document Reviewed: 06/30/2017 Elsevier Patient Education  [employer].  If you have lab work done today you will be contacted with your lab results within the next 2 weeks.  If you have not heard from [place] then please contact us. The fastest way to get your results is to register for My Chart.   IF you received an x-ray today, you will receive an invoice from [facility]. Please contact [facility] at [phone] with  questions or concerns regarding your invoice.   IF you received labwork today, you will receive an invoice from [facility]. Please contact [facility] at [phone] with questions or concerns regarding your invoice.   Our billing staff will not be able to assist you with questions regarding bills from these companies.  You will be contacted with the lab results as soon as they are available. The fastest way to get your results is to activate your My Chart account. Instructions are located on the last page of this paperwork. If you have not heard from [place] regarding the results in 2 weeks, please contact this office.        Return if symptoms worsen or fail to improve.   Janace Hoard, MD 04/24/2020

## 2020-04-29 ENCOUNTER — Other Ambulatory Visit: Payer: Self-pay

## 2020-04-29 ENCOUNTER — Ambulatory Visit (INDEPENDENT_AMBULATORY_CARE_PROVIDER_SITE_OTHER): Payer: Self-pay | Admitting: Family Medicine

## 2020-04-29 ENCOUNTER — Encounter: Payer: Self-pay | Admitting: Family Medicine

## 2020-04-29 VITALS — BP 122/54 | HR 71 | Wt 167.8 lb

## 2020-04-29 DIAGNOSIS — N951 Menopausal and female climacteric states: Secondary | ICD-10-CM

## 2020-04-29 MED ORDER — VENLAFAXINE HCL ER 75 MG PO CP24: 75.0000 mg | ORAL_CAPSULE | Freq: Every day | ORAL | 3 refills | Status: AC

## 2020-04-29 NOTE — Patient Instructions (Signed)
La menopausia Menopause La menopausia es el perodo normal de la vida en el que los perodos menstruales cesan por completo. Por lo general, se confirma tras 12meses de no haber tenido un perodo menstrual. La transicin a la menopausia (perimenopausia), con mayor frecuencia, ocurre entre los 45 y los 55aos. Durante la perimenopausia, los niveles hormonales cambian en el organismo, lo que puede provocar sntomas y afectar la salud. La menopausia podra aumentar el riesgo de sufrir lo siguiente:  Prdida de masa sea (osteoporosis), lo que predispone a la rotura de huesos (fracturas).  Depresin.  Endurecimiento y estrechamiento de las arterias (aterosclerosis), lo que puede causar infartos de miocardio y accidentes cerebrovasculares. Cules son las causas? A menudo, la causa de esta afeccin es un cambio natural en los niveles hormonales, que ocurre a medida que envejece. La afeccin tambin podra ser provocada por una ciruga en la que se extraigan ambos ovarios (ooforectoma bilateral). Qu incrementa el riesgo? Es ms probable que esta afeccin comience a una temprana edad si tiene ciertas afecciones o se realiza ciertos tratamientos, incluidos los siguientes:  Un tumor en la hipfisis del cerebro.  Una enfermedad que afecte los ovarios y la produccin de hormonas.  Radioterapia para tratar el cncer.  Ciertos tratamientos contra el cncer, como quimioterapia o terapia hormonal (antiestrgeno).  Fumar mucho y consumir alcohol de forma excesiva.  Antecedentes familiares de menopausia temprana. Adems, es ms probable que esta afeccin se presente de manera temprana en las mujeres que son muy delgadas. Cules son los signos o los sntomas? Los sntomas de esta afeccin incluyen los siguientes:  Acaloramiento.  Perodos menstruales irregulares.  Sudoracin nocturna.  Cambios en el sentimiento respecto de las relaciones sexuales. Es posible que el deseo sexual disminuya o que  se sienta ms cmoda respecto de su sexualidad.  Sequedad vaginal y adelgazamiento de las paredes de la vagina. Esto podra hacer que sienta dolor durante las relaciones sexuales.  Sequedad de la piel y aparicin de arrugas.  Dolores de cabeza.  Problemas para dormir (insomnio).  Cambios de humor o irritabilidad.  Problemas de memoria.  Aumento de peso.  Crecimiento de bello en la cara y el pecho.  Infecciones en la vejiga o problemas para orinar. Cmo se diagnostica? Esta afeccin se diagnostica en funcin de los antecedentes mdicos, un examen fsico, la edad, los antecedentes menstruales y los sntomas. Tambin le podran realizar estudios hormonales. Cmo se trata? En algunos los casos, no se necesita tratamiento. Usted y el mdico deben decidir juntos si se debe realizar un tratamiento. El tratamiento se determinar en funcin de su cuadro clnico y de sus preferencias. El tratamiento de este cuadro clnico se centra en el control de los sntomas. El tratamiento puede incluir lo siguiente:  Terapia hormonal para la menopausia.  Medicamentos para tratar sntomas o complicaciones especficos.  Acupuntura.  Vitaminas o suplementos herbales. Antes de comenzar el tratamiento, es importante que le avise al mdico si tiene antecedentes personales o familiares de lo siguiente:  Enfermedades cardacas.  Cncer de mama.  Cogulos de sangre.  Diabetes.  Osteoporosis. Siga estas indicaciones en su casa: Estilo de vida  No consuma ningn producto que contenga nicotina o tabaco, como cigarrillos y cigarrillos electrnicos. Si necesita ayuda para dejar de fumar, consulte al mdico.  Realice, por lo menos, 30minutos de actividad fsica 5das por semana o ms.  Evite las bebidas con alcohol o cafena, as como las comidas muy condimentadas. Esto podra ayudar a prevenir los acaloramientos.  Intente dormir de 7   a 8horas todas las noches.  Si tiene  acaloramientos: ? Vstase en capas. ? Evite las cosas que podran desencadenar los acaloramientos, como las comidas muy condimentadas, los lugares calientes o el estrs. ? Respire profundamente y despacio cuando comience a sentir acaloramiento. ? Tenga un ventilador en su casa y en su oficina.  Encuentre modos de controlar el estrs, por ejemplo, a travs de la respiracin, meditacin o un diario ntimo.  Considere la posibilidad de asistir a terapia grupal con otras mujeres que tengan sntomas de menopausia. Pdale recomendaciones al mdico sobre reuniones de terapia grupal. Comida y bebida  Siga una dieta saludable y equilibrada que incluya cereales integrales, protenas magras, productos lcteos descremados y muchas frutas y verduras.  El mdico podra recomendarle que agregue una mayor cantidad de soja a su dieta. Algunos de los alimentos que contienen soja son el tofu, el tempeh y la leche de soja.  Consuma muchos alimentos que contengan calcio y vitaminaD para mejorar la salud sea. Algunos productos que contienen mucho calcio son los lcteos descremados, el yogur, los frijoles, las almendras, las sardinas, el brcoli y la col rizada. Medicamentos  Tome los medicamentos de venta libre y los recetados solamente como se lo haya indicado el mdico.  Hable con el mdico antes de empezar a tomar cualquier suplemento herbal. Tome las vitaminas y los suplementos recetados como se lo haya indicado el mdico. Estos pueden incluir lo siguiente: ? Calcio. Las mujeres de 51aos o ms deben consumir 1200mg (miligramos) de calcio todos los das. ? VitaminaD. Las mujeres necesitan entre 600 y 800unidades internacionales de vitaminaD todos los das. ? VitaminasB12 yB6. Procure consumir 50microgramos de vitaminaB12 y 1,5mg de vitaminaB6 todos los das. Instrucciones generales  Lleve un registro de sus perodos menstruales; incluya lo siguiente: ? El momento en que ocurren. ? Qu tan  abundantes son y cunto duran. ? Cunto tiempo transcurre entre cada perodo menstrual.  Lleve un registro de los sntomas; anote cundo comienzan, con qu frecuencia ocurren y cunto duran.  Use lubricantes o humectantes vaginales para aliviar la sequedad vaginal y mejorar el bienestar durante las relaciones sexuales.  Concurra a todas las visitas de control como se lo haya indicado el mdico. Esto es importante. Esto incluye la terapia grupal y la psicoterapia. Comunquese con un mdico si:  An tiene perodos menstruales despus de los 55aos.  Siente dolor durante las relaciones sexuales.  No tuvo perodos menstruales durante los ltimos 12meses y presenta sangrado vaginal. Solicite ayuda de inmediato si:  Tiene los siguientes sntomas: ? Depresin grave. ? Sangrado vaginal excesivo. ? Dolor al orinar. ? Latidos cardacos rpidos o irregulares (palpitaciones). ? Dolor de cabeza intenso. ? Dolor en el abdomen (abdominal) o dispepsia grave.  Se cay y cree que se ha fracturado un hueso.  Siente dolor en el pecho o en la pierna.  Desarrolla problemas de visin.  Se toca un bulto en la mama. Resumen  La menopausia es el perodo normal de la vida en el que los perodos menstruales cesan por completo. Por lo general, se confirma tras 12meses de no haber tenido un perodo menstrual.  La transicin a la menopausia (perimenopausia), con mayor frecuencia, ocurre entre los 45 y los 55aos.  Los sntomas pueden controlarse mediante medicamentos, cambios en el estilo de vida y terapias complementarias, como acupuntura.  Siga una dieta equilibrada que incluya muchos nutrientes para favorecer la salud sea y la salud cardaca, y para controlar los sntomas durante la menopausia. Esta informacin no tiene como   fin reemplazar el consejo del mdico. Asegrese de hacerle al mdico cualquier pregunta que tenga. Document Revised: 06/30/2017 Document Reviewed: 06/30/2017 Elsevier Patient  Education  2020 Elsevier Inc.  

## 2020-04-29 NOTE — Progress Notes (Signed)
   Subjective:    Patient ID: Rita Sanchez is a 49 y.o. female presenting with No chief complaint on file.  on 04/29/2020  HPI: New pt, referred for menopausal symptoms. Has PMH of HTN, stroke, and on-going tobacco use. Reports hot flashes and irregular periods. States she is having trouble sleeping at night due to night sweats. Notes symptoms started 5 years ago (hot flashes were mild) and lately getting worse. Soaking bed clothes and having to change everything. More nights than most. Also with hot flashes in the daytime. Having 3-4 times daily. Definitely worse at night. LMP was 08/2019. Cycles have been irregular.  Review of Systems  Constitutional: Negative for chills and fever.  Respiratory: Negative for shortness of breath.   Cardiovascular: Negative for chest pain.  Gastrointestinal: Negative for abdominal pain, nausea and vomiting.  Genitourinary: Negative for dysuria.  Skin: Negative for rash.      Objective:    BP (!) 122/54   Pulse 71   Wt 167 lb 12.8 oz (76.1 kg)   LMP  (LMP Unknown) Comment: LMP 6 or 7 months ago  BMI 33.89 kg/m  Physical Exam Constitutional:      General: She is not in acute distress.    Appearance: She is well-developed.  HENT:     Head: Normocephalic and atraumatic.  Eyes:     General: No scleral icterus. Cardiovascular:     Rate and Rhythm: Normal rate.  Pulmonary:     Effort: Pulmonary effort is normal.  Abdominal:     Palpations: Abdomen is soft.  Musculoskeletal:     Cervical back: Neck supple.  Skin:    General: Skin is warm and dry.  Neurological:     Mental Status: She is alert and oriented to person, place, and time.         Assessment & Plan:   Problem List Items Addressed This Visit      Unprioritized   Symptomatic menopausal or female climacteric states - Primary    Advised of HRT risks and benefits. She has h/o stroke and HTN and is currently smoking. Will try Effexor for hot flashes plus lifestyle related  foods, clothing, temperature setting.  Also discussed changes related to menopause, normal progression and resolution of symptoms.         Relevant Medications   venlafaxine XR (EFFEXOR XR) 75 MG 24 hr capsule      Total time in review of prior notes, pathology, labs, history taking, review with patient, exam, note writing, discussion of options, plan for next steps, alternatives and risks of treatment: 42 minutes.  Return in about 3 months (around 07/30/2020) for in person language barrier.  Reva Bores 04/29/2020 10:41 AM

## 2020-04-29 NOTE — Progress Notes (Signed)
Positive PHQ-9; score of 19, negative SI. Pt reports history of depression. Is not currently being followed for this. Offered [facility] services; pt declined.   Fleet Contras RN 04/29/20

## 2020-04-29 NOTE — Assessment & Plan Note (Signed)
Advised of HRT risks and benefits. She has h/o stroke and HTN and is currently smoking. Will try Effexor for hot flashes plus lifestyle related foods, clothing, temperature setting.  Also discussed changes related to menopause, normal progression and resolution of symptoms.

## 2020-05-27 ENCOUNTER — Other Ambulatory Visit: Payer: Self-pay | Admitting: Obstetrics and Gynecology

## 2020-05-27 DIAGNOSIS — Z1231 Encounter for screening mammogram for malignant neoplasm of breast: Secondary | ICD-10-CM

## 2020-07-03 ENCOUNTER — Ambulatory Visit: Payer: Self-pay | Admitting: *Deleted

## 2020-07-03 ENCOUNTER — Ambulatory Visit
Admission: RE | Admit: 2020-07-03 | Discharge: 2020-07-03 | Disposition: A | Discharge status: Home / Self Care | Payer: Self-pay | Source: Ambulatory Visit | Attending: Obstetrics and Gynecology | Admitting: Obstetrics and Gynecology

## 2020-07-03 ENCOUNTER — Other Ambulatory Visit: Payer: Self-pay

## 2020-07-03 VITALS — BP 120/84 | Temp 97.1°F | Wt 169.4 lb

## 2020-07-03 DIAGNOSIS — F32A Depression, unspecified: Secondary | ICD-10-CM

## 2020-07-03 DIAGNOSIS — Z1239 Encounter for other screening for malignant neoplasm of breast: Secondary | ICD-10-CM

## 2020-07-03 DIAGNOSIS — Z1231 Encounter for screening mammogram for malignant neoplasm of breast: Secondary | ICD-10-CM

## 2020-07-03 NOTE — Progress Notes (Signed)
Ms. Rita Sanchez is a 49 y.o. female who presents to [facility] clinic today with no complaints.    Pap Smear: Pap smear not completed today. Last Pap smear was 02/06/2018 at [facility] clinic and was normal with negative HPV. Per patient has no history of an abnormal Pap smear. Last Pap smear result is available in Epic.   Physical exam: Breasts Breasts symmetrical. No skin abnormalities bilateral breasts. No nipple retraction bilateral breasts. No nipple discharge bilateral breasts. No lymphadenopathy. No lumps palpated bilateral breasts. No complaints of pain or tenderness on exam.       Pelvic/Bimanual Pap is not indicated today per BCCCP guidelines.   Smoking History: Patient is a current smoker. Discussed smoking cessation with patient. Referred to the [facility] Quitline and gave resources to the free smoking cessation classes at [facility].   Patient Navigation: Patient education provided. Access to services provided for patient through [place] program. Spanish interpreter Natale Lay from [facility] provided.   Colorectal Cancer Screening: Per patient has never had colonoscopy completed. No complaints today.    Breast and Cervical Cancer Risk Assessment: Patient does not have family history of breast cancer, known genetic mutations, or radiation treatment to the chest before age 30. Patient does not have history of cervical dysplasia, immunocompromised, or DES exposure in-utero.  Risk Assessment    Risk Scores      07/03/2020   Last edited by: Meryl Dare, CMA   5-year risk: 0.6 %   Lifetime risk: 6 %          A: BCCCP exam without pap smear No complaints.  P: Referred patient to the [facility] for a screening mammogram on the mobile unit. Appointment scheduled Thursday, July 03, 2020 at 1030.  Priscille Heidelberg, RN 07/03/2020 9:36 AM

## 2020-07-03 NOTE — Progress Notes (Signed)
Received notification from BCCCP that pt reports feeling depressed since her last office visit at [facility] in June.  Pt requested to be seen by [facility].  Amb ref placed.  BCCCP notified pt that someone will call her to schedule an appt.    Addison Naegeli, RN  07/03/20

## 2020-07-03 NOTE — Patient Instructions (Addendum)
Explained breast self awareness with Rita Sanchez. Patient did not need a Pap smear today due to last Pap smear and HPV typing was 02/06/2018. Let her know BCCCP will cover Pap smears and HPV typing every 5 years unless has a history of abnormal Pap smears. Referred patient to the [facility] of [facility] for a screening mammogram on the mobile unit. Appointment scheduled Thursday, July 03, 2020 at 1030. Patient escorted to mobile unit following BCCCP appointment for her mammogram. Let patient know the Breast Center will follow up with her within the next couple weeks with results of her mammogram by letter or phone. Discussed smoking cessation with patient. Referred to the [facility] Quitline and gave resources to the free smoking cessation classes at [facility]. [name] verbalized understanding.  Anette Barra, Kathaleen Maser, RN 9:36 AM

## 2020-07-22 NOTE — BH Specialist Note (Addendum)
Integrated Behavioral Health via Telemedicine Phone (Caregility) Visit  I reviewed patient visit with the [facility] Intern, and I concur with the treatment plan, as documented in the [facility] Intern note.   No charge for this visit due to [facility] Intern seeing patient.  [name], MSW, LCSW Integrated Behavioral Health Clinician Center for [employer] [facility] Healthcare at [facility] for Women   07/22/2020 Rita Sanchez [id number]  Number of Integrated Behavioral Health visits: 1 Session Start time: 8:15  Session End time: 8:58 Total time: 43 minutes  Referring Provider: Catalina Antigua, MD Type of Service: Individual Patient/Family location: Home [facility] Provider location: Center for [employer] Healthcare at [facility] for Women  All persons participating in visit: Patient Rita Sanchez and [facility] [name] and [facility] intern [name]  I connected with Heloise Purpura  by a video enabled telemedicine application (Caregility) and verified that I am speaking with the correct person using two identifiers.   Discussed confidentiality: Yes   Confirmed demographics & insurance:  Yes   I discussed that engaging in this virtual visit, they consent to the provision of behavioral healthcare and the services will be billed under their insurance.   Patient and/or legal guardian expressed understanding and consented to virtual visit: Yes   PRESENTING CONCERNS: Patient and/or family reports the following symptoms/concerns: Pt states her primary concern today is concern over being uninsured, and feelings of depression   Duration of problem: ongoing; Severity of problem: moderately severe  STRENGTHS (Protective Factors/Coping Skills): Patient has a strong support system and reports an active lifestyle. Patient reports healthy coping habits such as walking and playing with her grandchildren.  ASSESSMENT: Patient currently experiencing adjustment disorder with mixed  anxiety and depression.   GOALS ADDRESSED: Patient will: 1.  Reduce symptoms of: depression  2.  Increase knowledge and/or ability of: coping skills  3.  Demonstrate ability to: reduce symptoms of depression through the use of healthy coping skills.   Progress of Goals: Ongoing  INTERVENTIONS: Interventions utilized:  Mining engineer, Supportive Counseling, Sleep Hygiene and Psychoeducation and/or Health Education Standardized Assessments completed & reviewed: GAD-7 and PHQ 9   OUTCOME: Patient Response: Patient stated an understanding of the strategies discussed and agrees to the treatment plan  *Planning/Engaging in activities, including exercise  *Cognitive Behavioral: Systematic approach to life problems *Combined treatment   PLAN: 1. Follow up with behavioral health clinician on : 08/31/2020 @ 8:15am 2. Behavioral recommendations:  -Begin taking BH medication as prescribed -Spend at least 20 minutes outdoors with grandchildren daily -Begin using sleep sounds at night for improved sleep 3. Referral(s): Integrated [employer] (In Clinic)  I discussed the assessment and treatment plan with the patient and/or parent/guardian. They were provided an opportunity to ask questions and all were answered. They agreed with the plan and demonstrated an understanding of the instructions.   They were advised to call back or seek an in-person evaluation as appropriate.  I discussed that the purpose of this visit is to provide behavioral health care while limiting exposure to the novel coronavirus.  Discussed there is a possibility of technology failure and discussed alternative modes of communication if that failure occurs.  Valetta Close [facility]  Depression screen [facility] 2/9 07/24/2020 04/29/2020 04/24/2020 03/14/2020 01/01/2020  Decreased Interest 3 3 0 0 0  Down, Depressed, Hopeless 3 3 0 0 0  PHQ - 2 Score 6 6 0 0 0  Altered sleeping 2 2 - - -  Tired, decreased energy 3  3 - - -  Change in appetite 1 3 - - -  Feeling bad or failure about yourself  0 1 - - -  Trouble concentrating 3 2 - - -  Moving slowly or fidgety/restless 1 2 - - -  Suicidal thoughts 0 0 - - -  PHQ-9 Score 16 19 - - -  Some recent data might be hidden   GAD 7 : Generalized Anxiety Score 07/24/2020 04/29/2020 04/24/2020  Nervous, Anxious, on Edge 3 3 0  Control/stop worrying 0 2 0  Worry too much - different things 0 0 0  Trouble relaxing 3 0 0  Restless 0 3 0  Easily annoyed or irritable 0 0 0  Afraid - awful might happen 0 0 0  Total GAD 7 Score 6 8 0  Anxiety Difficulty - - Not difficult at all

## 2020-07-24 ENCOUNTER — Other Ambulatory Visit: Payer: Self-pay | Admitting: Obstetrics and Gynecology

## 2020-07-24 ENCOUNTER — Ambulatory Visit: Payer: Worker's Compensation | Admitting: Clinical

## 2020-07-24 DIAGNOSIS — F4323 Adjustment disorder with mixed anxiety and depressed mood: Secondary | ICD-10-CM

## 2020-07-24 MED ORDER — SERTRALINE HCL 25 MG PO TABS: 25.0000 mg | ORAL_TABLET | Freq: Every day | ORAL | 1 refills | Status: DC

## 2020-07-31 ENCOUNTER — Ambulatory Visit: Payer: Self-pay | Admitting: Clinical

## 2020-07-31 ENCOUNTER — Other Ambulatory Visit: Payer: Self-pay

## 2020-07-31 DIAGNOSIS — F4323 Adjustment disorder with mixed anxiety and depressed mood: Secondary | ICD-10-CM

## 2020-07-31 NOTE — BH Specialist Note (Signed)
Integrated Behavioral Health via Telemedicine Video (Caregility) Visit  07/31/2020 Rita Sanchez [id number]  Number of Integrated Behavioral Health visits: 2 Session Start time: 8:16  Session End time: 8:28 Total time: 12 minutes  Referring Provider: Catalina Antigua, MD Type of Service: Individual Patient/Family location: Home [facility] Provider location: Center for [employer] Healthcare at [facility] for Women  All persons participating in visit: Patient Rita Sanchez and [facility] Rita Sanchez     I connected with Rita Sanchez  by a video enabled telemedicine application (Caregility) and verified that I am speaking with the correct person using two identifiers.   Discussed confidentiality: at previous visit  Confirmed demographics & insurance:  Yes   I discussed that engaging in this virtual visit, they consent to the provision of behavioral healthcare and the services will be billed under their insurance.   Patient and/or legal guardian expressed understanding and consented to virtual visit: Yes   PRESENTING CONCERNS: Patient and/or family reports the following symptoms/concerns: Pt states she has not picked up Zoloft yet, and plans to pick up today; pt is spending time with grandchildren outdoors daily, and has improved her sleep by taking Nyquil two nights in a row.  Duration of problem: Ongoing; Severity of problem: moderate  STRENGTHS (Protective Factors/Coping Skills): Strong support system, active lifestyle; healthy coping habits   ASSESSMENT: Patient currently experiencing Adjustment disorder with mixed anxiety and depression.    GOALS ADDRESSED: Patient will: 1.  Reduce symptoms of: depression  2.  Demonstrate ability to: Increase healthy adjustment to current life circumstances   Progress of Goals: Ongoing  INTERVENTIONS: Interventions utilized:  Supportive Counseling and Medication Monitoring Standardized Assessments completed &  reviewed: Not Needed   OUTCOME: Patient Response: Pt agrees to treatment plan   PLAN: 1. Follow up with behavioral health clinician on : Phone call in one week 2. Behavioral recommendations:  -Pick up medication today at pharmacy, and begin taking as prescribed -Continue spending time outdoors with grandchildren daily -Continue to prioritize healthy sleep nightly 3. Referral(s): Integrated [employer] (In Clinic)  I discussed the assessment and treatment plan with the patient and/or parent/guardian. They were provided an opportunity to ask questions and all were answered. They agreed with the plan and demonstrated an understanding of the instructions.   They were advised to call back or seek an in-person evaluation as appropriate.  I discussed that the purpose of this visit is to provide behavioral health care while limiting exposure to the novel coronavirus.  Discussed there is a possibility of technology failure and discussed alternative modes of communication if that failure occurs.  Rita Sanchez Rita Sanchez

## 2020-08-04 NOTE — BH Specialist Note (Signed)
error 

## 2020-08-07 ENCOUNTER — Ambulatory Visit: Payer: Self-pay | Admitting: Clinical

## 2020-08-07 DIAGNOSIS — F4323 Adjustment disorder with mixed anxiety and depressed mood: Secondary | ICD-10-CM

## 2020-08-07 NOTE — BH Specialist Note (Signed)
Integrated Behavioral Health via Telemedicine Video (Caregility) Visit  I reviewed patient visit with the [facility] Intern, and I concur with the treatment plan, as documented in the [facility] Intern note.   No charge for this visit due to [place] Sanchez, [place] Intern seeing patient.  Rita Sanchez, MSW, LCSW Integrated Behavioral Health Clinician Center for [facility] Healthcare at [facility] for Women  08/07/2020 Rita Sanchez [id number]  Number of Integrated Behavioral Health visits: 3 Session Start time: 8:15  Session End time: 8:49 Total time: 35  minutes  Referring Provider: Catalina Antigua, MD Type of Service: Individual Patient/Family location: Home Rita Sanchez Provider location: Center for [employer] at [facility] for Women All persons participating in visit: Patient Rita Sanchez, [facility] Intern Rita Sanchez and [facility] Rita Sanchez   I connected with Rita Sanchez and/or Rita Sanchez's patient by a phone telemedicine application (Caregility) and verified that I am speaking with the correct person using two identifiers.   Discussed confidentiality: Yes   Confirmed demographics & insurance:  Yes   I discussed that engaging in this virtual visit, they consent to the provision of behavioral healthcare and the services will be billed under their insurance.   Patient and/or legal guardian expressed understanding and consented to virtual visit: Yes   PRESENTING CONCERNS: Patient and/or family reports the following symptoms/concerns: Pt stats that she has been taking Zoloft for three days now. Pt reports feeling a lot better in her mood but is still having difficulty sleeping. Pt reports she takes Nyquil on nights she has insomnia but the Zoloft has improved her sleep. Pt reports she is spending time with her grandchildren daily and working to get outdoors each day. Duration of problem: ongoing; Severity of problem: moderate  STRENGTHS (Protective  Factors/Coping Skills): Strong support system, active lifestyle, optimistic spirit, healthy coping habits Physical Health (exercise, healthy diet, medication compliance, etc.)  ASSESSMENT: Patient currently experiencing Adjustment disorder with mixed anxiety and depression.    GOALS ADDRESSED: Patient will: 1.  Reduce symptoms of: depression and anxiety 2.  Demonstrate ability to: practice grounding exercises   Progress of Goals: Ongoing  INTERVENTIONS: Interventions utilized:  Supportive Counseling, Medication Monitoring, Sleep Hygiene and Psychoeducation and/or Health Education Standardized Assessments completed & reviewed: GAD-7 and PHQ 9   OUTCOME: Patient Response: Pt agrees to treatment pplan   PLAN: 1. Follow up with behavioral health clinician on : 08/14/2020 @ 8:15 2. Behavioral recommendations:  - continue taking Zoloft as prescribed - utilize breathing and grounding techniques when anxious 3. Referral(s): Integrated Rita Sanchez (In Clinic)  I discussed the assessment and treatment plan with the patient and/or parent/guardian. They were provided an opportunity to ask questions and all were answered. They agreed with the plan and demonstrated an understanding of the instructions.   They were advised to call back or seek an in-person evaluation as appropriate.  I discussed that the purpose of this visit is to provide behavioral health care while limiting exposure to the novel coronavirus.  Discussed there is a possibility of technology failure and discussed alternative modes of communication if that failure occurs.  Rita Sanchez

## 2020-08-14 ENCOUNTER — Ambulatory Visit: Payer: No Typology Code available for payment source | Admitting: Clinical

## 2020-08-14 DIAGNOSIS — F4323 Adjustment disorder with mixed anxiety and depressed mood: Secondary | ICD-10-CM

## 2020-08-14 NOTE — BH Specialist Note (Signed)
Integrated Behavioral Health via Telemedicine Phone Visit  I reviewed patient visit with the [facility] Intern, and I concur with the treatment plan, as documented in the [facility] Intern note.   No charge for this visit due to [facility] Intern seeing patient.  [name], MSW, LCSW Integrated Behavioral Health Clinician Center for [facility] Healthcare at [facility] for Women 08/14/2020 KARLENA LUEBKE [id number]  [id number] [facility]  Number of Integrated Behavioral Health visits: 4 Session Start time: 8:15  Session End time: 8:34 Total time: 20 minutes  Referring Provider: Catalina Antigua, MD Type of Service: Individual, Family, Individual  Patient/Family location: home [facility] Provider location: [facility] All persons participating in visit: Kais[place], [place], Interpreter # [phone] Cleotilde Neer   I connected with Heloise Purpura and/or [name]'s patient by phone and verified that I am speaking with the correct person using two identifiers.   Discussed confidentiality: Yes   Confirmed demographics & insurance:  Yes   I discussed that engaging in this virtual visit, they consent to the provision of behavioral healthcare and the services will be billed under their insurance.   Patient and/or legal guardian expressed understanding and consented to virtual visit: Yes   PRESENTING CONCERNS: Patient and/or family reports the following symptoms/concerns: anxiety Duration of problem: ongoing; Severity of problem: mild   STRENGTHS (Protective Factors/Coping Skills): Social connections, Concrete supports in place (healthy food, safe environments, etc.) and Physical Health (exercise, healthy diet, medication compliance, etc.)  ASSESSMENT: Patient currently experiencing mild anxiety.    GOALS ADDRESSED: Patient will: 1.  Reduce symptoms of: anxiety  2.  Increase knowledge and/or ability of: coping skills and stress reduction  3.  Demonstrate ability to: Decrease  self-medicating behaviors   Progress of Goals: Ongoing  INTERVENTIONS: Interventions utilized:  Copywriter, advertising and Supportive Counseling Standardized Assessments completed & reviewed: Not Needed   OUTCOME: Patient Response: Pt is handling medication well and reports significant improvement in her sleep and anxiety. Pt reports feeling very tranquil. Pt states she is no longer using Nyquil to treat insomnia, as she is able to sleep on her own. Pt will continue to take Zoloft as prescribed and will check in with [facility] in 2 weeks for follow up.    PLAN: 1. Follow up with behavioral health clinician on : 08/28/2020 @ 8:15am 2. Behavioral recommendations: Continue taking Zoloft as prescribed, grounding techniques 3. Referral(s): Integrated [employer] (In Clinic)  I discussed the assessment and treatment plan with the patient and/or parent/guardian. They were provided an opportunity to ask questions and all were answered. They agreed with the plan and demonstrated an understanding of the instructions.   They were advised to call back or seek an in-person evaluation as appropriate.  I discussed that the purpose of this visit is to provide behavioral health care while limiting exposure to the novel coronavirus.  Discussed there is a possibility of technology failure and discussed alternative modes of communication if that failure occurs.  [name]

## 2020-08-28 ENCOUNTER — Other Ambulatory Visit: Payer: Self-pay | Admitting: Obstetrics and Gynecology

## 2020-08-28 ENCOUNTER — Telehealth: Payer: Self-pay | Admitting: Clinical

## 2020-08-28 ENCOUNTER — Ambulatory Visit: Payer: Worker's Compensation | Admitting: Clinical

## 2020-08-28 DIAGNOSIS — F4323 Adjustment disorder with mixed anxiety and depressed mood: Secondary | ICD-10-CM

## 2020-08-28 MED ORDER — SERTRALINE HCL 25 MG PO TABS: 25.0000 mg | ORAL_TABLET | Freq: Every day | ORAL | 1 refills | Status: AC

## 2020-08-28 NOTE — Telephone Encounter (Cosign Needed)
Called pt using interpreter Tobi Bastos [phone]). Pt was informed she had one refill remaining and she should go ahead and get this medication refilled prior to running out. Pt stated she would. [facility] intern will consult with prescribing physician to see if refills need to prescribed by her PCP moving forward and update pt with information. -Darcel Smalling

## 2020-08-28 NOTE — BH Specialist Note (Signed)
Integrated Behavioral Health via Telemedicine Phone Visit   08/28/2020 [name] [id number]   I reviewed patient visit with the [facility] Intern, and I concur with the treatment plan, as documented in the [facility] Intern note.   No charge for this visit due to [facility] Intern seeing patient.  [name], MSW, LCSW Integrated Behavioral Health Clinician Center for [employer] at [facility]  Number of Integrated Behavioral Health visits: 5 Session Start time: 8:15am  Session End time: 8:40 Total time: 25 minutes  Referring Provider: Catalina Antigua, MD Type of Service: Individual Patient/Family location: [facility] Provider location: [facility] All persons participating in visit: Canaan Prue, [facility] Intern Darcel Smalling, translator (# [phone])   I connected with Rita Sanchez and/or Rita Sanchez's patient by a video enabled telemedicine application ([employer]) and verified that I am speaking with the correct person using two identifiers.   Discussed confidentiality: Yes   Confirmed demographics & insurance:  Yes   I discussed that engaging in this virtual visit, they consent to the provision of behavioral healthcare and the services will be billed under their insurance.   Patient and/or legal guardian expressed understanding and consented to virtual visit: Yes   PRESENTING CONCERNS: Patient and/or family reports the following symptoms/concerns: anxiety and depression Duration of problem: ongoing; Severity of problem: mild  STRENGTHS (Protective Factors/Coping Skills): Social connections, Concrete supports in place (healthy food, safe environments, etc.) and Sense of purpose  ASSESSMENT: Patient currently experiencing a decreased appetite and mild sleep disturbance, but reports symptoms of anxiety and depression have significantly increased.    GOALS ADDRESSED: Patient will:  1.  Reduce symptoms of: anxiety  2.  Increase knowledge  and/or ability of: coping skills, healthy habits and self-management skills  3.  Demonstrate ability to: continue managing symptoms and practicing grounding techniques  Progress of Goals: Achieved  INTERVENTIONS: Interventions utilized:  Motivational Interviewing Standardized Assessments completed & reviewed: GAD-7 and PHQ 9   OUTCOME: Patient Response: Pt states that she is feeling much better and feels improved in all areas. Pt plans to continue taking medication as prescribed by doctor. Pt feels comfortable discontinuing services at this time and will call back if she needs to continue services.    PLAN: 1. Follow up with behavioral health clinician on : as needed 2. Behavioral recommendations: continue taking medication as prescribed and practicing grounding techniques to improve sleep 3. Referral(s): Integrated [employer] (In Clinic)  I discussed the assessment and treatment plan with the patient and/or parent/guardian. They were provided an opportunity to ask questions and all were answered. They agreed with the plan and demonstrated an understanding of the instructions.   They were advised to call back or seek an in-person evaluation as appropriate.  I discussed that the purpose of this visit is to provide behavioral health care while limiting exposure to the novel coronavirus.  Discussed there is a possibility of technology failure and discussed alternative modes of communication if that failure occurs.  [name]

## 2020-09-03 ENCOUNTER — Telehealth: Payer: Self-pay | Admitting: Clinical

## 2020-09-04 NOTE — Telephone Encounter (Cosign Needed)
Called pt to discuss medication. Pt did not answer so HIPAA compliant message was left for pt to call back.

## 2020-09-17 ENCOUNTER — Telehealth: Payer: Self-pay | Admitting: Clinical

## 2020-09-17 NOTE — Telephone Encounter (Signed)
Spoke with pt to let her know that her PCP, Dr. Kateri Plummer, would need to take over management of her Zoloft prescription. Pt stated understanding and will give her PCP a call to coordinate this. [facility] supervisor will also send a message to PCP.  Darcel Smalling [facility] Intern. Supervisor [employer])

## 2022-03-14 IMAGING — MG DIGITAL SCREENING BILAT W/ TOMO W/ CAD
6 of 10 series · 6 of 30 positions shown · non-contrast
Comparison: Previous exam(s).

CLINICAL DATA: Screening.

EXAM:
DIGITAL SCREENING BILATERAL MAMMOGRAM WITH TOMO AND CAD

[R CC synth-2D]
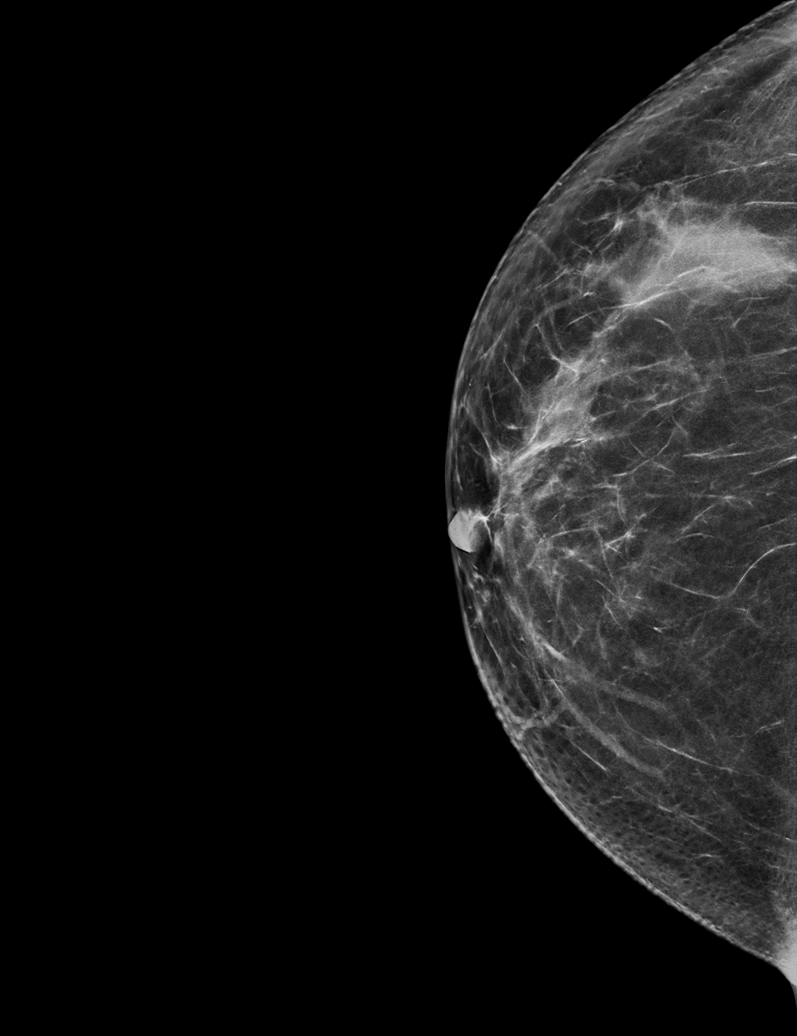

[L MLO synth-2D (1 of 2)]
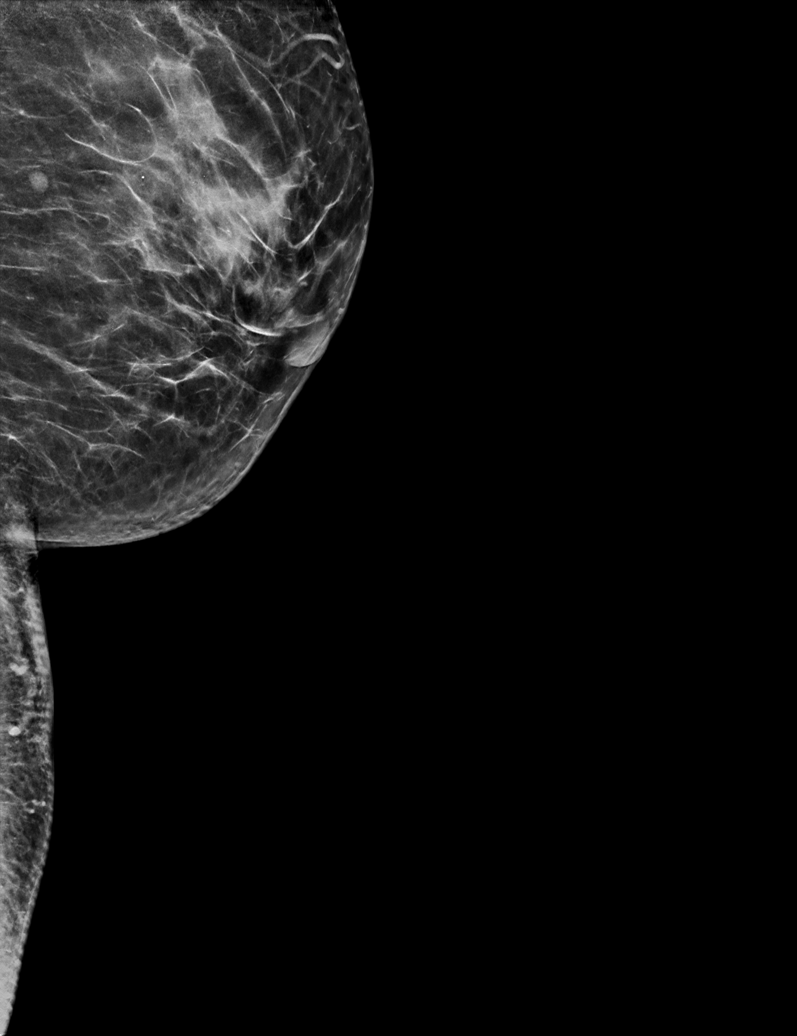

[L CC synth-2D]
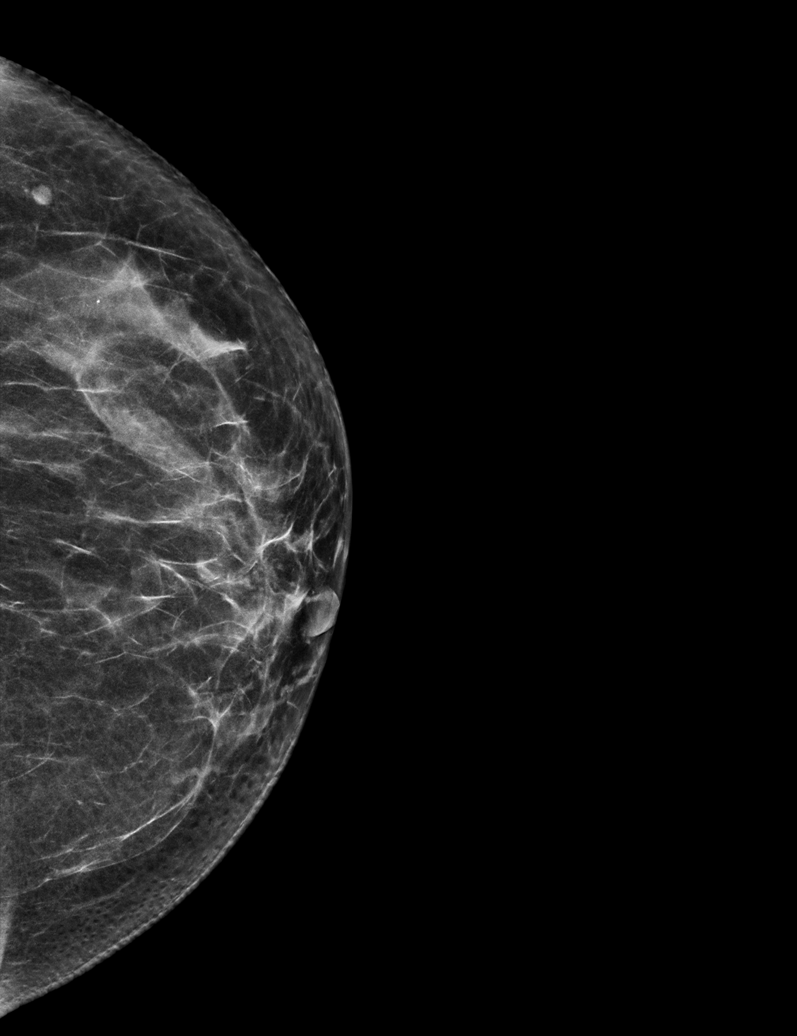

[L MLO synth-2D (2 of 2)]
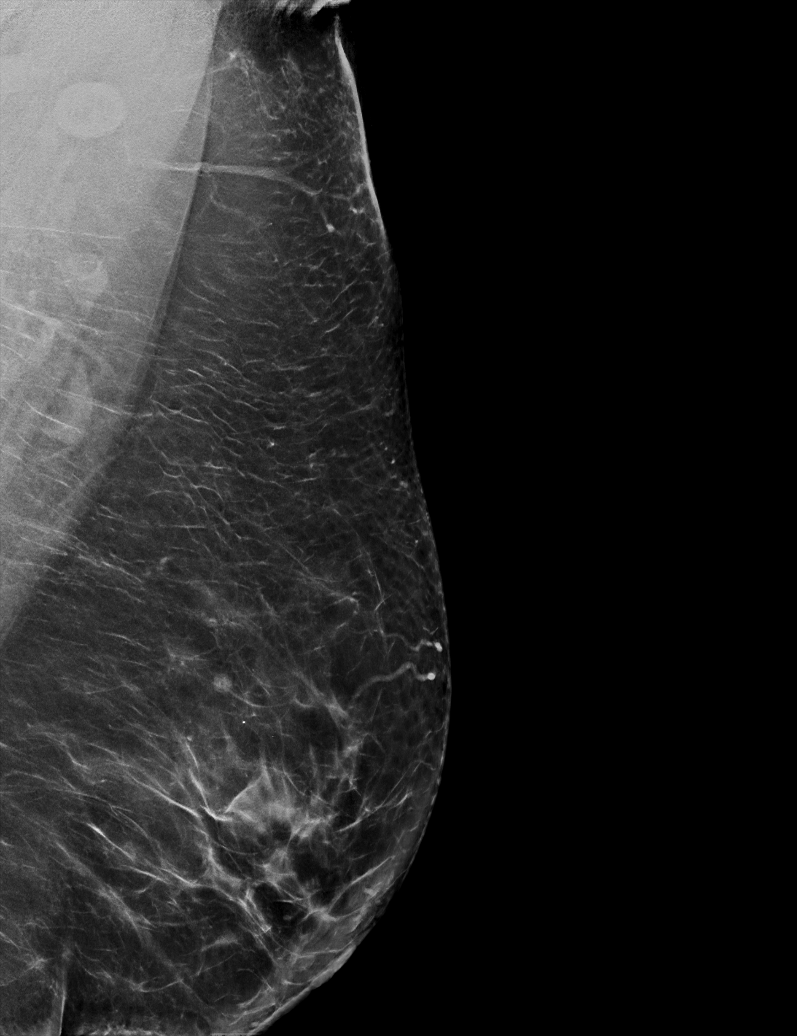

[R MLO synth-2D]
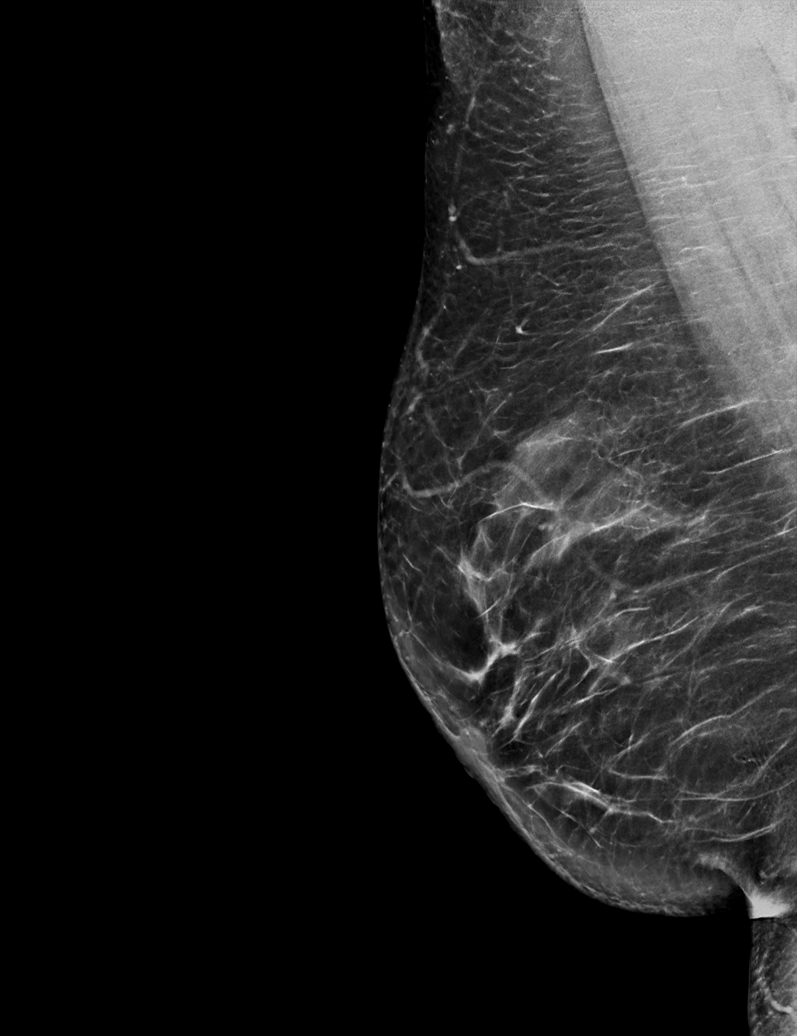

[L CC tomo · tomo slice 32/63.0]
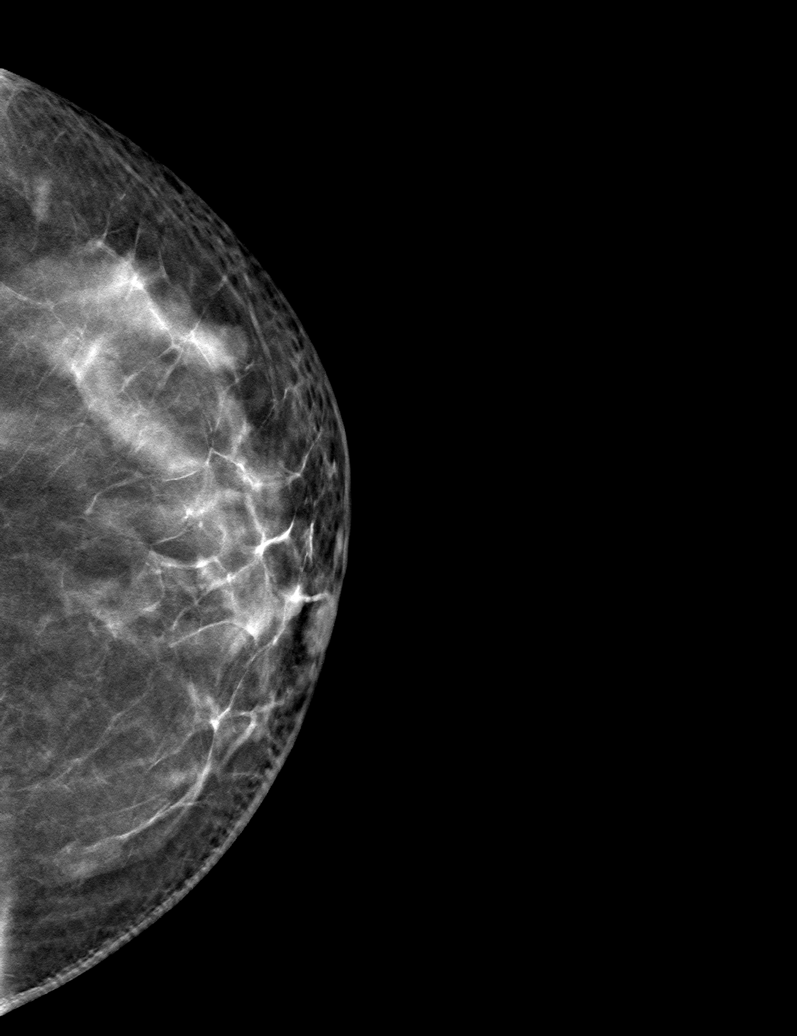

[6 of 30 positions shown; findings below may reference images not displayed]

ACR Breast Density Category c: The breast tissue is heterogeneously
dense, which may obscure small masses.
FINDINGS: There are no findings suspicious for malignancy. Images were
processed with CAD.
IMPRESSION: No mammographic evidence of malignancy. A result letter of this
screening mammogram will be mailed directly to the patient.

RECOMMENDATION:
Screening mammogram in one year. (Code:[id number])

BI-RADS CATEGORY  1: Negative.

## 2023-04-25 ENCOUNTER — Other Ambulatory Visit: Payer: Self-pay

## 2023-04-25 ENCOUNTER — Encounter (HOSPITAL_BASED_OUTPATIENT_CLINIC_OR_DEPARTMENT_OTHER): Payer: Self-pay | Admitting: Emergency Medicine

## 2023-04-25 ENCOUNTER — Emergency Department (HOSPITAL_BASED_OUTPATIENT_CLINIC_OR_DEPARTMENT_OTHER): Payer: No Typology Code available for payment source

## 2023-04-25 ENCOUNTER — Emergency Department (HOSPITAL_BASED_OUTPATIENT_CLINIC_OR_DEPARTMENT_OTHER)
Admission: EM | Admit: 2023-04-25 | Discharge: 2023-04-26 | Disposition: A | Discharge status: Home / Self Care | Payer: No Typology Code available for payment source | Attending: Emergency Medicine | Admitting: Emergency Medicine

## 2023-04-25 DIAGNOSIS — K5900 Constipation, unspecified: Secondary | ICD-10-CM | POA: Insufficient documentation

## 2023-04-25 DIAGNOSIS — K6289 Other specified diseases of anus and rectum: Secondary | ICD-10-CM | POA: Insufficient documentation

## 2023-04-25 LAB — COMPREHENSIVE METABOLIC PANEL
ALT: 49 U/L — ABNORMAL HIGH (ref 0–44)
AST: 35 U/L (ref 15–41)
Albumin: 4.7 g/dL (ref 3.5–5.0)
Alkaline Phosphatase: 40 U/L (ref 38–126)
Anion gap: 11 (ref 5–15)
BUN: 15 mg/dL (ref 6–20)
CO2: 23 mmol/L (ref 22–32)
Calcium: 10.1 mg/dL (ref 8.9–10.3)
Chloride: 103 mmol/L (ref 98–111)
Creatinine, Ser: 0.65 mg/dL (ref 0.44–1.00)
GFR, Estimated: >60 mL/min (ref >60)
Glucose, Bld: 135 mg/dL — ABNORMAL HIGH (ref 70–99)
Potassium: 3.9 mmol/L (ref 3.5–5.1)
Sodium: 137 mmol/L (ref 135–145)
Total Bilirubin: 0.3 mg/dL (ref 0.3–1.2)
Total Protein: 7.4 g/dL (ref 6.5–8.1)

## 2023-04-25 LAB — CBC WITH DIFFERENTIAL/PLATELET
Abs Immature Granulocytes: 0.02 10*3/uL (ref 0.00–0.07)
Basophils Absolute: 0.1 10*3/uL (ref 0.0–0.1)
Basophils Relative: 1 %
Eosinophils Absolute: 0.6 10*3/uL — ABNORMAL HIGH (ref 0.0–0.5)
Eosinophils Relative: 7 %
HCT: 40.2 % (ref 36.0–46.0)
Hemoglobin: 14.3 g/dL (ref 12.0–15.0)
Immature Granulocytes: 0 %
Lymphocytes Relative: 39 %
Lymphs Abs: 3.5 10*3/uL (ref 0.7–4.0)
MCH: 31.6 pg (ref 26.0–34.0)
MCHC: 35.6 g/dL (ref 30.0–36.0)
MCV: 88.7 fL (ref 80.0–100.0)
Monocytes Absolute: 0.9 10*3/uL (ref 0.1–1.0)
Monocytes Relative: 11 %
Neutro Abs: 3.8 10*3/uL (ref 1.7–7.7)
Neutrophils Relative %: 42 %
Platelets: 256 10*3/uL (ref 150–400)
RBC: 4.53 MIL/uL (ref 3.87–5.11)
RDW: 11.9 % (ref 11.5–15.5)
WBC: 8.9 10*3/uL (ref 4.0–10.5)
nRBC: 0 % (ref 0.0–0.2)

## 2023-04-25 LAB — LIPASE, BLOOD: Lipase: 50 U/L (ref 11–51)

## 2023-04-25 MED ORDER — SODIUM CHLORIDE 0.9 % IV BOLUS
1000.0000 mL | Freq: Once | INTRAVENOUS | Status: AC
  Administered 2023-04-25: 1000 mL via INTRAVENOUS

## 2023-04-25 MED ORDER — ONDANSETRON HCL 4 MG/2ML IJ SOLN
4.0000 mg | Freq: Once | INTRAMUSCULAR | Status: AC
  Administered 2023-04-25: 4 mg via INTRAVENOUS
  Filled 2023-04-25: qty 2

## 2023-04-25 MED ORDER — MORPHINE SULFATE (PF) 4 MG/ML IV SOLN
4.0000 mg | Freq: Once | INTRAVENOUS | Status: AC
  Administered 2023-04-25: 4 mg via INTRAVENOUS
  Filled 2023-04-25: qty 1

## 2023-04-25 MED ORDER — IOHEXOL 300 MG/ML  SOLN
100.0000 mL | Freq: Once | INTRAMUSCULAR | Status: AC | PRN
  Administered 2023-04-25: 85 mL via INTRAVENOUS

## 2023-04-25 NOTE — ED Triage Notes (Signed)
C/o constipation. Feels pressure in rectum. Change in Bms after menopause. Small BM yesterday morning "gooey"    Has not tried OTC meds   Appears uncomfortable in triage   Family provider translation, refused professional service

## 2023-04-25 NOTE — ED Provider Notes (Signed)
[place] EMERGENCY DEPARTMENT AT [facility] Provider Note   CSN: [id number] Arrival date & time: 04/25/23  2119     History  Chief Complaint  Patient presents with   Constipation    Rita Sanchez is a 52 y.o. female.  Patient is a 52 year old female with history of migraines, hyperlipidemia, prior C-section x 3.  Patient presenting today with complaints of constipation.  She reports feeling as though there is a "ball of stool" in her rectum that she cannot get out.  Last bowel movement was 3 days ago.  No fevers or chills.  No bloody stools.  No alleviating factors.  The history is provided by the patient.       Home Medications Prior to Admission medications   Medication Sig Start Date End Date Taking? Authorizing Provider  metroNIDAZOLE (FLAGYL) 500 MG tablet Take 1 tablet (500 mg total) by mouth 2 (two) times daily with a meal. DO NOT CONSUME ALCOHOL WHILE TAKING THIS MEDICATION. Patient not taking: Reported on 07/03/2020 04/24/20   Peyton Najjar, MD  sertraline (ZOLOFT) 25 MG tablet Take 1 tablet (25 mg total) by mouth daily. 08/28/20   Constant, Peggy, MD  triamcinolone cream (KENALOG) 0.1 % Apply a small amount of cream 2 times daily to the rash and itching area. Patient not taking: Reported on 07/03/2020 04/24/20   Peyton Najjar, MD  venlafaxine XR (EFFEXOR XR) 75 MG 24 hr capsule Take 1 capsule (75 mg total) by mouth daily with breakfast. 04/29/20   Reva Bores, MD      Allergies    Patient has no known allergies.    Review of Systems   Review of Systems  All other systems reviewed and are negative.   Physical Exam Updated Vital Signs BP (!) 138/92 (BP Location: Right Arm)   Pulse 72   Temp (!) 97.5 F (36.4 C)   Resp 20   LMP  (LMP Unknown) Comment: LMP 6 or 7 months ago  SpO2 99%  Physical Exam Vitals and nursing note reviewed.  Constitutional:      General: She is not in acute distress.    Appearance: She is well-developed. She is  not diaphoretic.  HENT:     Head: Normocephalic and atraumatic.  Cardiovascular:     Rate and Rhythm: Normal rate and regular rhythm.     Heart sounds: No murmur heard.    No friction rub. No gallop.  Pulmonary:     Effort: Pulmonary effort is normal. No respiratory distress.     Breath sounds: Normal breath sounds. No wheezing.  Abdominal:     General: Bowel sounds are normal. There is no distension.     Palpations: Abdomen is soft.     Tenderness: There is no abdominal tenderness.  Genitourinary:    Comments: Rectal examination is normal.  There is no stool noted in the rectal vault. Musculoskeletal:        General: Normal range of motion.     Cervical back: Normal range of motion and neck supple.  Skin:    General: Skin is warm and dry.  Neurological:     General: No focal deficit present.     Mental Status: She is alert and oriented to person, place, and time.     ED Results / Procedures / Treatments   Labs (all labs ordered are listed, but only abnormal results are displayed) Labs Reviewed  COMPREHENSIVE METABOLIC PANEL  CBC WITH DIFFERENTIAL/PLATELET  LIPASE, BLOOD  EKG None  Radiology No results found.  Procedures Procedures    Medications Ordered in ED Medications  sodium chloride 0.9 % bolus 1,000 mL (has no administration in time range)  ondansetron (ZOFRAN) injection 4 mg (has no administration in time range)  morphine (PF) 4 MG/ML injection 4 mg (has no administration in time range)    ED Course/ Medical Decision Making/ A&P  Patient is a 52 year old female presenting with complaints of constipation and pressure in her rectum.  This has been occurring for the past 3 days.  She describes decreased bowel movements and feels as though there is a ball of stool in her rectum she cannot push out.  She denies any fevers or chills.  No bloody stools.  She arrives here afebrile with stable vital signs.  Physical examination is basically unremarkable.   Abdomen is benign and rectal examination reveals no evidence for fecal impaction.  Workup initiated including CBC, CMP, lipase, all of which were unremarkable.  There is no leukocytosis.  CT scan of the abdomen and pelvis obtained showing no evidence for fecal impaction/constipation, or other acute intra-abdominal process.  Patient was given normal saline along with Zofran for nausea and morphine for pain.  She is now feeling markedly improved.  I am uncertain as to the exact etiology of her discomfort as her workup is negative.  Perhaps patient has some sort of diverticulitis and identified on the CT scan.  I will treat with Flagyl and see if this helps.  Patient will be referred to gastroenterology to discuss colonoscopy as she has never had 1 of these in the past.  To return as needed for any problems.  Final Clinical Impression(s) / ED Diagnoses Final diagnoses:  None    Rx / DC Orders ED Discharge Orders     None         Geoffery Lyons, MD 04/26/23 [phone]

## 2023-04-26 ENCOUNTER — Emergency Department (HOSPITAL_BASED_OUTPATIENT_CLINIC_OR_DEPARTMENT_OTHER): Payer: No Typology Code available for payment source

## 2023-04-26 MED ORDER — METRONIDAZOLE 500 MG PO TABS: 500.0000 mg | ORAL_TABLET | Freq: Three times a day (TID) | ORAL | 0 refills | Status: AC

## 2023-04-26 NOTE — Discharge Instructions (Signed)
Begin taking Flagyl as prescribed.  Follow-up with gastroenterology in the next few days.  The contact information for Dr. Dulce Sellar has been provided in this discharge summary for you to call and make these arrangements.  Return to the ER for severe abdominal pain, bloody stools, high fevers, or for other new and concerning symptoms.

## 2023-05-04 ENCOUNTER — Emergency Department (HOSPITAL_BASED_OUTPATIENT_CLINIC_OR_DEPARTMENT_OTHER): Payer: No Typology Code available for payment source | Admitting: Radiology

## 2023-05-04 ENCOUNTER — Emergency Department (HOSPITAL_BASED_OUTPATIENT_CLINIC_OR_DEPARTMENT_OTHER)
Admission: EM | Admit: 2023-05-04 | Discharge: 2023-05-04 | Disposition: A | Discharge status: Home / Self Care | Payer: No Typology Code available for payment source | Attending: Emergency Medicine | Admitting: Emergency Medicine

## 2023-05-04 ENCOUNTER — Other Ambulatory Visit: Payer: Self-pay

## 2023-05-04 ENCOUNTER — Encounter (HOSPITAL_BASED_OUTPATIENT_CLINIC_OR_DEPARTMENT_OTHER): Payer: Self-pay | Admitting: Emergency Medicine

## 2023-05-04 DIAGNOSIS — K5900 Constipation, unspecified: Secondary | ICD-10-CM | POA: Insufficient documentation

## 2023-05-04 DIAGNOSIS — R531 Weakness: Secondary | ICD-10-CM | POA: Insufficient documentation

## 2023-05-04 LAB — URINALYSIS, ROUTINE W REFLEX MICROSCOPIC
Bilirubin Urine: NEGATIVE
Glucose, UA: NEGATIVE mg/dL
Hgb urine dipstick: NEGATIVE
Ketones, ur: NEGATIVE mg/dL
Leukocytes,Ua: NEGATIVE
Nitrite: NEGATIVE
Protein, ur: NEGATIVE mg/dL
Specific Gravity, Urine: <1.005 — ABNORMAL LOW (ref 1.005–1.030)
pH: 7.5 (ref 5.0–8.0)

## 2023-05-04 LAB — COMPREHENSIVE METABOLIC PANEL
ALT: 42 U/L (ref 0–44)
AST: 29 U/L (ref 15–41)
Albumin: 4.4 g/dL (ref 3.5–5.0)
Alkaline Phosphatase: 36 U/L — ABNORMAL LOW (ref 38–126)
Anion gap: 11 (ref 5–15)
BUN: 10 mg/dL (ref 6–20)
CO2: 22 mmol/L (ref 22–32)
Calcium: 9.8 mg/dL (ref 8.9–10.3)
Chloride: 105 mmol/L (ref 98–111)
Creatinine, Ser: 0.58 mg/dL (ref 0.44–1.00)
GFR, Estimated: >60 mL/min (ref >60)
Glucose, Bld: 102 mg/dL — ABNORMAL HIGH (ref 70–99)
Potassium: 3.7 mmol/L (ref 3.5–5.1)
Sodium: 138 mmol/L (ref 135–145)
Total Bilirubin: 0.5 mg/dL (ref 0.3–1.2)
Total Protein: 7.6 g/dL (ref 6.5–8.1)

## 2023-05-04 LAB — CBC WITH DIFFERENTIAL/PLATELET
Abs Immature Granulocytes: 0.03 10*3/uL (ref 0.00–0.07)
Basophils Absolute: 0.1 10*3/uL (ref 0.0–0.1)
Basophils Relative: 1 %
Eosinophils Absolute: 0.3 10*3/uL (ref 0.0–0.5)
Eosinophils Relative: 4 %
HCT: 39.7 % (ref 36.0–46.0)
Hemoglobin: 14.1 g/dL (ref 12.0–15.0)
Immature Granulocytes: 0 %
Lymphocytes Relative: 35 %
Lymphs Abs: 3.1 10*3/uL (ref 0.7–4.0)
MCH: 31 pg (ref 26.0–34.0)
MCHC: 35.5 g/dL (ref 30.0–36.0)
MCV: 87.3 fL (ref 80.0–100.0)
Monocytes Absolute: 0.8 10*3/uL (ref 0.1–1.0)
Monocytes Relative: 9 %
Neutro Abs: 4.5 10*3/uL (ref 1.7–7.7)
Neutrophils Relative %: 51 %
Platelets: 262 10*3/uL (ref 150–400)
RBC: 4.55 MIL/uL (ref 3.87–5.11)
RDW: 11.7 % (ref 11.5–15.5)
WBC: 8.8 10*3/uL (ref 4.0–10.5)
nRBC: 0 % (ref 0.0–0.2)

## 2023-05-04 LAB — TROPONIN I (HIGH SENSITIVITY)
Troponin I (High Sensitivity): 2 ng/L (ref <18)
Troponin I (High Sensitivity): 2 ng/L (ref <18)

## 2023-05-04 MED ORDER — FLEET ENEMA 7-19 GM/118ML RE ENEM
1.0000 | ENEMA | Freq: Once | RECTAL | Status: AC
  Administered 2023-05-04: 1 via RECTAL
  Filled 2023-05-04: qty 1

## 2023-05-04 MED ORDER — POLYETHYLENE GLYCOL 3350 17 G PO PACK: 17.0000 g | PACK | Freq: Every day | ORAL | 0 refills | Status: AC

## 2023-05-04 NOTE — ED Provider Notes (Signed)
[place] EMERGENCY DEPARTMENT AT [facility] Provider Note   CSN: [id number] Arrival date & time: 05/04/23  1239     History  Chief Complaint  Patient presents with   Constipation    Rita Sanchez is a 52 y.o. female, history of migraines, hyperlipidemia, 3 C-sections, who presents to the ED secondary to difficulty having a stool.  She states she had a stool 4 days ago, but since then has had difficulty and feels like there is something in her rectum.  She states that she feels like there is a lot of pressure, and there is a ball of stool in her rectum and she cannot get it out.  Denies any current vaginal pain, urinary symptoms, but does endorse suprapubic pain.  She states that she was given a medication,  last week, and it made her feel better.  She is not on any kind of stool softeners, is not taking any narcotics, or long-term pain medications.  Denies blood in her stool.  Has not had a colonoscopy in a long time.  Notes that she had some pain the other day, but now she has only suprapubic pain, that is stabbing in nature.  Associated nausea, but no vomiting.  Also states that when she went to the bathroom, she got very dizzy, and did not feel well.  She states she feels fine now.  Home Medications Prior to Admission medications   Medication Sig Start Date End Date Taking? Authorizing Provider  polyethylene glycol (MIRALAX) 17 g packet Take 17 g by mouth daily. 05/04/23  Yes Ndea Kilroy L, PA  metroNIDAZOLE (FLAGYL) 500 MG tablet Take 1 tablet (500 mg total) by mouth 3 (three) times daily. One po tid x 7 days 04/26/23   Geoffery Lyons, MD  sertraline (ZOLOFT) 25 MG tablet Take 1 tablet (25 mg total) by mouth daily. 08/28/20   Constant, Peggy, MD  triamcinolone cream (KENALOG) 0.1 % Apply a Jake Fuhrmann amount of cream 2 times daily to the rash and itching area. Patient not taking: Reported on 07/03/2020 04/24/20   Peyton Najjar, MD  venlafaxine XR (EFFEXOR XR) 75 MG 24 hr  capsule Take 1 capsule (75 mg total) by mouth daily with breakfast. 04/29/20   Reva Bores, MD      Allergies    Patient has no known allergies.    Review of Systems   Review of Systems  Constitutional:  Negative for fever.  Gastrointestinal:  Positive for abdominal pain, constipation and nausea.    Physical Exam Updated Vital Signs BP 100/68   Pulse 61   Temp 98.2 F (36.8 C) (Oral)   Resp 18   Ht 4\' 11"  (1.499 m)   Wt 76.7 kg   LMP  (LMP Unknown) Comment: LMP 6 or 7 months ago  SpO2 99%   BMI 34.13 kg/m  Physical Exam Vitals and nursing note reviewed. Exam conducted with a chaperone present.  Constitutional:      General: She is not in acute distress.    Appearance: She is well-developed.  HENT:     Head: Normocephalic and atraumatic.  Eyes:     Conjunctiva/sclera: Conjunctivae normal.  Cardiovascular:     Rate and Rhythm: Normal rate and regular rhythm.     Heart sounds: No murmur heard. Pulmonary:     Effort: Pulmonary effort is normal. No respiratory distress.     Breath sounds: Normal breath sounds.  Abdominal:     Palpations: Abdomen is soft.  Tenderness: There is abdominal tenderness in the suprapubic area.  Genitourinary:    Rectum: Tenderness present. No mass or external hemorrhoid.  Musculoskeletal:        General: No swelling.     Cervical back: Neck supple.  Skin:    General: Skin is warm and dry.     Capillary Refill: Capillary refill takes less than 2 seconds.  Neurological:     Mental Status: She is alert.  Psychiatric:        Mood and Affect: Mood normal.     ED Results / Procedures / Treatments   Labs (all labs ordered are listed, but only abnormal results are displayed) Labs Reviewed  COMPREHENSIVE METABOLIC PANEL - Abnormal; Notable for the following components:      Result Value   Glucose, Bld 102 (*)    Alkaline Phosphatase 36 (*)    All other components within normal limits  URINALYSIS, ROUTINE W REFLEX MICROSCOPIC -  Abnormal; Notable for the following components:   Color, Urine COLORLESS (*)    Specific Gravity, Urine <1.005 (*)    All other components within normal limits  CBC WITH DIFFERENTIAL/PLATELET  TROPONIN I (HIGH SENSITIVITY)  TROPONIN I (HIGH SENSITIVITY)    EKG EKG Interpretation Date/Time:  Wednesday May 04 2023 14:28:14 EDT Ventricular Rate:  58 PR Interval:  126 QRS Duration:  82 QT Interval:  441 QTC Calculation: 434 R Axis:   82  Text Interpretation: Sinus rhythm No significant change since last tracing Confirmed by Melene Plan [phone]) on 05/04/2023 2:33:39 PM  Radiology DG Abdomen 1 View  Result Date: 05/04/2023 CLINICAL DATA:  Rectal pain, difficulty stooling. EXAM: ABDOMEN - 1 VIEW COMPARISON:  CT abdomen/pelvis 04/26/2023 FINDINGS: There is a nonobstructive bowel gas pattern. There is a moderate stool burden in the ascending and transverse colon. There is no definite free intraperitoneal air. There is no gross organomegaly or abnormal soft tissue calcification. Cholecystectomy clips are noted. There is no acute osseous abnormality. IMPRESSION: Moderate stool burden in the ascending and transverse colon. Electronically Signed   By: Lesia Hausen M.D.   On: 05/04/2023 15:55    Procedures Procedures    Medications Ordered in ED Medications  sodium phosphate (FLEET) 7-19 GM/118ML enema 1 enema (1 enema Rectal Given 05/04/23 1558)    ED Course/ Medical Decision Making/ A&P                             Medical Decision Making Patient is a 52 year old female, here for feeling of having to have a stool, but unable to.  Also states she got dizzy when going to the bathroom.  Okay obtain an EKG, blood work, as well as a KUB for further evaluation.  She has no distention of her abdomen, I reviewed the CT abdomen pelvis, performed about 12 days ago, and it was unremarkable.  No evidence of masses or uterine issues.  We will also give a enema and see if there is any relief of her  symptoms.  She was quite tender on exam, it is possible she may have some internal hemorrhoids that are bothering her.  She has no acute neurodeficits on exam thus we will hold off on CT head.   Amount and/or Complexity of Data Reviewed Labs: ordered.    Details: Troponin within normal limits, no electrolyte abnormalities, urinalysis negative Radiology: ordered.    Details: KUB shows moderate stool burden in the ascending and transverse of  colon ECG/medicine tests:  Decision-making details documented in ED Course. Discussion of management or test interpretation with external provider(s): Patient is a 52 year old female, here for rectal pain and pressure, has been going on for the last couple weeks.  She had a negative CT scanning of a couple weeks ago, we obtained a KUB which showed moderate stool in ascending and transverse colon, she does not have any stool in her rectal vault.  I believe that her pain may be secondary to the feeling of need to have a bowel movement from the constipation, versus a possible internal hemorrhoid inside.  There does not appear to be any fissures noted on my exam, or any kind of blood on the exam.  We will start her on MiraLAX for this.  Additionally she did have an episode of dizziness, while ambulating to the bathroom, this is now resolved, orthostatics are within normal limits, her troponin is within normal limits, no findings on EKG.  Neurologically intact.  Will have patient follow-up with her primary care doctor.  Risk OTC drugs.  Function within normal limits  Final Clinical Impression(s) / ED Diagnoses Final diagnoses:  Constipation, unspecified constipation type  Weakness    Rx / DC Orders ED Discharge Orders          Ordered    polyethylene glycol (MIRALAX) 17 g packet  Daily        05/04/23 1700              Infant Doane Elbert Ewings, PA 05/04/23 1704    Melene Plan, DO 05/05/23 0805

## 2023-05-04 NOTE — ED Notes (Signed)
Pt tolerated enema well, pt reports she did not have BM.

## 2023-05-04 NOTE — ED Notes (Signed)
Pt discharged in stable condition. Pt and daughter expressed understanding about discharge instructions and Rx, and to follow up with pcp and to return to ER for any further concerns or complications. Pt ambulated out with even steady gait, no apparent distress.

## 2023-05-04 NOTE — ED Triage Notes (Signed)
Patient arrives ambulatory by POV with daughter states she came here a few days ago having rectal pain and constipation. States Sunday she took laxative and went to bathroom that night then has not been able to go again.

## 2023-05-04 NOTE — Discharge Instructions (Addendum)
Please follow-up with your primary care doctor, make sure you are drinking lots of fluids.  Your electrolytes were reassuring today as well as your EKG.  Return to the ER if you feel very dizzy, short of breath, or have severe abdominal pain.  I am suspicious that you are feeling of needing to have a bowel movement is secondary to your constipation.  I prescribed you a laxative to help with this, you can also take senna over-the-counter for constipation.  I recommend that you follow-up with a primary care doctor and get a colonoscopy as well.

## 2023-10-05 ENCOUNTER — Other Ambulatory Visit: Payer: Self-pay | Admitting: Gastroenterology

## 2023-10-14 ENCOUNTER — Encounter (HOSPITAL_COMMUNITY): Payer: Self-pay | Admitting: Gastroenterology

## 2023-10-25 ENCOUNTER — Other Ambulatory Visit: Payer: Self-pay

## 2023-10-25 ENCOUNTER — Encounter (HOSPITAL_COMMUNITY): Payer: Self-pay | Admitting: Gastroenterology

## 2023-10-25 ENCOUNTER — Ambulatory Visit (HOSPITAL_COMMUNITY): Payer: No Typology Code available for payment source

## 2023-10-25 ENCOUNTER — Ambulatory Visit (HOSPITAL_COMMUNITY)
Admission: RE | Admit: 2023-10-25 | Discharge: 2023-10-25 | Disposition: A | Discharge status: Home / Self Care | Payer: No Typology Code available for payment source | Attending: Gastroenterology | Admitting: Gastroenterology

## 2023-10-25 ENCOUNTER — Encounter (HOSPITAL_COMMUNITY)
Admission: RE | Disposition: A | Discharge status: Home / Self Care | Payer: Self-pay | Source: Home / Self Care | Attending: Gastroenterology

## 2023-10-25 DIAGNOSIS — K2950 Unspecified chronic gastritis without bleeding: Secondary | ICD-10-CM | POA: Insufficient documentation

## 2023-10-25 DIAGNOSIS — K5730 Diverticulosis of large intestine without perforation or abscess without bleeding: Secondary | ICD-10-CM | POA: Insufficient documentation

## 2023-10-25 DIAGNOSIS — Z1211 Encounter for screening for malignant neoplasm of colon: Secondary | ICD-10-CM | POA: Insufficient documentation

## 2023-10-25 DIAGNOSIS — K5790 Diverticulosis of intestine, part unspecified, without perforation or abscess without bleeding: Secondary | ICD-10-CM

## 2023-10-25 DIAGNOSIS — K2270 Barrett's esophagus without dysplasia: Secondary | ICD-10-CM

## 2023-10-25 DIAGNOSIS — K589 Irritable bowel syndrome without diarrhea: Secondary | ICD-10-CM | POA: Insufficient documentation

## 2023-10-25 DIAGNOSIS — K2970 Gastritis, unspecified, without bleeding: Secondary | ICD-10-CM

## 2023-10-25 DIAGNOSIS — Z8711 Personal history of peptic ulcer disease: Secondary | ICD-10-CM | POA: Insufficient documentation

## 2023-10-25 DIAGNOSIS — K635 Polyp of colon: Secondary | ICD-10-CM

## 2023-10-25 DIAGNOSIS — F17210 Nicotine dependence, cigarettes, uncomplicated: Secondary | ICD-10-CM | POA: Insufficient documentation

## 2023-10-25 DIAGNOSIS — K449 Diaphragmatic hernia without obstruction or gangrene: Secondary | ICD-10-CM | POA: Insufficient documentation

## 2023-10-25 DIAGNOSIS — K219 Gastro-esophageal reflux disease without esophagitis: Secondary | ICD-10-CM | POA: Insufficient documentation

## 2023-10-25 DIAGNOSIS — Z9049 Acquired absence of other specified parts of digestive tract: Secondary | ICD-10-CM | POA: Insufficient documentation

## 2023-10-25 DIAGNOSIS — K648 Other hemorrhoids: Secondary | ICD-10-CM | POA: Insufficient documentation

## 2023-10-25 DIAGNOSIS — D124 Benign neoplasm of descending colon: Secondary | ICD-10-CM | POA: Insufficient documentation

## 2023-10-25 DIAGNOSIS — I10 Essential (primary) hypertension: Secondary | ICD-10-CM | POA: Insufficient documentation

## 2023-10-25 DIAGNOSIS — F32A Depression, unspecified: Secondary | ICD-10-CM | POA: Insufficient documentation

## 2023-10-25 DIAGNOSIS — D123 Benign neoplasm of transverse colon: Secondary | ICD-10-CM | POA: Insufficient documentation

## 2023-10-25 DIAGNOSIS — K31A14 Gastric intestinal metaplasia without dysplasia, involving the cardia: Secondary | ICD-10-CM | POA: Insufficient documentation

## 2023-10-25 HISTORY — PX: POLYPECTOMY: SHX5525

## 2023-10-25 HISTORY — PX: COLONOSCOPY WITH PROPOFOL: SHX5780

## 2023-10-25 HISTORY — PX: BIOPSY: SHX5522

## 2023-10-25 HISTORY — PX: ESOPHAGOGASTRODUODENOSCOPY (EGD) WITH PROPOFOL: SHX5813

## 2023-10-25 SURGERY — COLONOSCOPY WITH PROPOFOL
Anesthesia: Monitor Anesthesia Care

## 2023-10-25 MED ORDER — PROPOFOL 1000 MG/100ML IV EMUL
INTRAVENOUS | Status: AC
  Filled 2023-10-25: qty 100

## 2023-10-25 MED ORDER — PROPOFOL 500 MG/50ML IV EMUL
INTRAVENOUS | Status: AC
  Filled 2023-10-25: qty 50

## 2023-10-25 MED ORDER — LIDOCAINE HCL (CARDIAC) PF 50 MG/5ML IV SOSY
PREFILLED_SYRINGE | INTRAVENOUS | Status: DC | PRN
  Administered 2023-10-25: 60 mg via INTRAVENOUS

## 2023-10-25 MED ORDER — SODIUM CHLORIDE 0.9 % IV SOLN: INTRAVENOUS | Status: DC | PRN

## 2023-10-25 MED ORDER — PROPOFOL 500 MG/50ML IV EMUL
INTRAVENOUS | Status: DC | PRN
  Administered 2023-10-25 (×2): 40 mg via INTRAVENOUS
  Administered 2023-10-25: 80 ug/kg/min via INTRAVENOUS
  Administered 2023-10-25: 40 mg via INTRAVENOUS

## 2023-10-25 MED ORDER — OMEPRAZOLE MAGNESIUM 20 MG PO TBEC: 40.0000 mg | DELAYED_RELEASE_TABLET | Freq: Every day | ORAL | 3 refills | Status: AC

## 2023-10-25 MED ORDER — GLYCOPYRROLATE 0.2 MG/ML IJ SOLN
INTRAMUSCULAR | Status: DC | PRN
  Administered 2023-10-25: .1 mg via INTRAVENOUS

## 2023-10-25 SURGICAL SUPPLY — 23 items
BLOCK BITE 60FR ADLT L/F BLUE (MISCELLANEOUS) ×2 IMPLANT
ELECT REM PT RETURN 9FT ADLT (ELECTROSURGICAL)
ELECTRODE REM PT RTRN 9FT ADLT (ELECTROSURGICAL) IMPLANT
FCP BXJMBJMB 240X2.8X (CUTTING FORCEPS)
FLOOR PAD 36X40 (MISCELLANEOUS) ×2
FORCEP RJ3 GP 1.8X160 W-NEEDLE (CUTTING FORCEPS) IMPLANT
FORCEPS BIOP RAD 4 LRG CAP 4 (CUTTING FORCEPS) IMPLANT
FORCEPS BXJMBJMB 240X2.8X (CUTTING FORCEPS) IMPLANT
INJECTOR/SNARE I SNARE (MISCELLANEOUS) IMPLANT
LUBRICANT JELLY 4.5OZ STERILE (MISCELLANEOUS) IMPLANT
MANIFOLD NEPTUNE II (INSTRUMENTS) IMPLANT
NDL SCLEROTHERAPY 25GX240 (NEEDLE) IMPLANT
NEEDLE SCLEROTHERAPY 25GX240 (NEEDLE)
PAD FLOOR 36X40 (MISCELLANEOUS) ×2 IMPLANT
PROBE APC STR FIRE (PROBE) IMPLANT
PROBE INJECTION GOLD 7FR (MISCELLANEOUS) IMPLANT
SNARE ROTATE MED OVAL 20MM (MISCELLANEOUS) IMPLANT
SNARE SHORT THROW 13M SML OVAL (MISCELLANEOUS) IMPLANT
SYR 50ML LL SCALE MARK (SYRINGE) IMPLANT
TRAP SPECIMEN MUCOUS 40CC (MISCELLANEOUS) IMPLANT
TUBING ENDO SMARTCAP PENTAX (MISCELLANEOUS) ×4 IMPLANT
TUBING IRRIGATION ENDOGATOR (MISCELLANEOUS) ×2 IMPLANT
WATER STERILE IRR 1000ML POUR (IV SOLUTION) IMPLANT

## 2023-10-25 NOTE — Op Note (Signed)
 [facility] Patient Name: Rita Sanchez Procedure Date: 10/25/2023 MRN: [id number] Attending MD: Layla Lah , MD, [id number] Date of Birth: 1971/06/24 CSN: [id number] Age: 53 Admit Type: Outpatient Procedure:                Colonoscopy Indications:              Screening for colorectal malignant neoplasm, This                            is the patient's first colonoscopy Providers:                Layla Lah, MD, Particia Fischer, RN, Corene Southgate, Technician Referring MD:             Layla Lah, MD Medicines:                Sedation Administered by an Anesthesia Professional Complications:            No immediate complications. Estimated Blood Loss:     Estimated blood loss was minimal. Procedure:                Pre-Anesthesia Assessment:                           - Prior to the procedure, a History and Physical                            was performed, and patient medications and                            allergies were reviewed. The patient's tolerance of                            previous anesthesia was also reviewed. The risks                            and benefits of the procedure and the sedation                            options and risks were discussed with the patient.                            All questions were answered, and informed consent                            was obtained. Prior Anticoagulants: The patient has                            taken no anticoagulant or antiplatelet agents. ASA                            Grade Assessment: III - A patient with severe  systemic disease. After reviewing the risks and                            benefits, the patient was deemed in satisfactory                            condition to undergo the procedure.                           After obtaining informed consent, the colonoscope                            was passed under direct vision.  Throughout the                            procedure, the patient's blood pressure, pulse, and                            oxygen saturations were monitored continuously. The                            [id number]-HQ190L ([id number] Olympus colonoscope was                            introduced through the anus and advanced to the the                            cecum, identified by appendiceal orifice and                            ileocecal valve. The colonoscopy was performed                            without difficulty. The patient tolerated the                            procedure well. The quality of the bowel                            preparation was adequate to identify polyps greater                            than 5 mm in size. The ileocecal valve, appendiceal                            orifice, and rectum were photographed. Scope In: 9:38:41 AM Scope Out: 9:54:22 AM Scope Withdrawal Time: 0 hours 11 minutes 57 seconds  Total Procedure Duration: 0 hours 15 minutes 41 seconds  Findings:      Hemorrhoids were found on perianal exam.      Two sessile polyps were found in the descending colon and transverse       colon. The polyps were small in size. These polyps were removed with a       cold snare. Resection and retrieval were complete.  A few diverticula were found in the sigmoid colon.      Internal hemorrhoids were found during retroflexion. The hemorrhoids       were small.      There was moderate spasm in the entire colon. Impression:               - Hemorrhoids found on perianal exam.                           - Two small polyps in the descending colon and in                            the transverse colon, removed with a cold snare.                            Resected and retrieved.                           - Diverticulosis in the sigmoid colon.                           - Internal hemorrhoids.                           - Moderate colonic spasm. Moderate Sedation:       Moderate (conscious) sedation was personally administered by an       anesthesia professional. The following parameters were monitored: oxygen       saturation, heart rate, blood pressure, and response to care. Recommendation:           - Patient has a contact number available for                            emergencies. The signs and symptoms of potential                            delayed complications were discussed with the                            patient. Return to normal activities tomorrow.                            Written discharge instructions were provided to the                            patient.                           - Resume previous diet.                           - Continue present medications.                           - Await pathology results.                           - Repeat  colonoscopy date to be determined after                            pending pathology results are reviewed for                            surveillance based on pathology results.                           - Return to my office in 6 months. Procedure Code(s):        --- Professional ---                           [phone], Colonoscopy, flexible; with removal of                            tumor(s), polyp(s), or other lesion(s) by snare                            technique Diagnosis Code(s):        --- Professional ---                           Z12.11, Encounter for screening for malignant                            neoplasm of colon                           D12.4, Benign neoplasm of descending colon                           D12.3, Benign neoplasm of transverse colon (hepatic                            flexure or splenic flexure)                           K64.8, Other hemorrhoids                           K58.9, Irritable bowel syndrome without diarrhea                           K57.30, Diverticulosis of large intestine without                            perforation or abscess without  bleeding CPT copyright 2022 American Medical Association. All rights reserved. The codes documented in this report are preliminary and upon coder review may  be revised to meet current compliance requirements. Layla Lah, MD Layla Lah, MD 10/25/2023 10:07:05 AM Number of Addenda: 0

## 2023-10-25 NOTE — Discharge Instructions (Signed)

## 2023-10-25 NOTE — Anesthesia Preprocedure Evaluation (Addendum)
 Anesthesia Evaluation  Patient identified by MRN, date of birth, ID band Patient awake    Reviewed: Allergy & Precautions, H&P , NPO status , Patient's Chart, lab work & pertinent test results  Airway Mallampati: II  TM Distance: >3 FB Neck ROM: Full    Dental no notable dental hx.    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 No other episodes of CVA / TIA    GI/Hepatic Neg liver ROS, PUD,GERD  ,,  Endo/Other  negative endocrine ROS    Renal/GU negative Renal ROS  negative genitourinary   Musculoskeletal  (+) Arthritis ,    Abdominal   Peds negative pediatric ROS (+)  Hematology negative hematology ROS (+)   Anesthesia Other Findings   Reproductive/Obstetrics negative OB ROS                             Anesthesia Physical Anesthesia Plan  ASA: 3  Anesthesia Plan: MAC   Post-op Pain Management:    Induction: Intravenous  PONV Risk Score and Plan: 1 and Propofol  infusion and Treatment may vary due to age or medical condition  Airway Management Planned: Natural Airway  Additional Equipment:   Intra-op Plan:   Post-operative Plan:   Informed Consent: I have reviewed the patients History and Physical, chart, labs and discussed the procedure including the risks, benefits and alternatives for the proposed anesthesia with the patient or authorized representative who has indicated his/her understanding and acceptance.     Dental advisory given  Plan Discussed with: CRNA  Anesthesia Plan Comments:         Anesthesia Quick Evaluation

## 2023-10-25 NOTE — Transfer of Care (Signed)
 Immediate Anesthesia Transfer of Care Note  Patient: Rita Sanchez  Procedure(s) Performed: COLONOSCOPY WITH PROPOFOL  ESOPHAGOGASTRODUODENOSCOPY (EGD) WITH PROPOFOL  BIOPSY POLYPECTOMY  Patient Location: PACU and Endoscopy Unit  Anesthesia Type:General  Level of Consciousness: awake, alert , and oriented  Airway & Oxygen Therapy: Patient Spontanous Breathing  Post-op Assessment: Report given to RN and Post -op Vital signs reviewed and stable  Post vital signs: Reviewed and stable  Last Vitals:  Vitals Value Taken Time  BP 109/72 10/25/23 1003  Temp    Pulse 85 10/25/23 1005  Resp 22 10/25/23 1005  SpO2 97 % 10/25/23 1005  Vitals shown include unfiled device data.  Last Pain:  Vitals:   10/25/23 0814  TempSrc: Temporal  PainSc: 0-No pain         Complications: No notable events documented.

## 2023-10-25 NOTE — H&P (Signed)
 Primary Care Physician:  Kip Ade, NP Primary Gastroenterologist:  Dr. Elicia  Reason for Visit -outpatient EGD and colonoscopy  HPI: Rita Sanchez is a 53 y.o. female with past medical history of migraine, history of cholecystectomy as well as C-sections. Looks like she was seen in the emergency department twice in July 2024 for constipation.  Blood work on May 04, 2023 showed normal CMP, normal CBC, normal lipase.  CT abdomen pelvis with IV contrast July 2024 showed no acute changes and fatty liver.  X-ray abdomen on May 04, 2023 showed moderate stool burden. She is feeling better now. Constipation has resolved but continues to have intermittent rectal pressure sensation during bowel movement.  She continues to have intermittent blood in the stool for many months now which she is attributed to hemorrhoids.  She denies any abdominal pain, reflux, nausea or vomiting.  Patient with reported history of gastric ulcer and was seen in office in February 2024. EGD and colonoscopy was recommended at that time but she never scheduled it because of cost issues.  History obtained with help of interpreter.    Past Medical History:  Diagnosis Date   Arthritis    Depression    Peptic ulcer    Seasonal allergies     Past Surgical History:  Procedure Laterality Date   CESAREAN SECTION  1986, 1988, 1991   CHOLECYSTECTOMY     patient states that only gallstones were removed   TUBAL LIGATION  1991    Prior to Admission medications   Medication Sig Start Date End Date Taking? Authorizing Provider  triamcinolone  cream (KENALOG ) 0.1 % Apply a small amount of cream 2 times daily to the rash and itching area. 04/24/20  Yes Tish Alm DEL, MD  metroNIDAZOLE  (FLAGYL ) 500 MG tablet Take 1 tablet (500 mg total) by mouth 3 (three) times daily. One po tid x 7 days 04/26/23   Geroldine Berg, MD  polyethylene glycol (MIRALAX ) 17 g packet Take 17 g by mouth daily. 05/04/23   Small, Brooke L, PA   sertraline  (ZOLOFT ) 25 MG tablet Take 1 tablet (25 mg total) by mouth daily. 08/28/20   Constant, Peggy, MD  venlafaxine  XR (EFFEXOR  XR) 75 MG 24 hr capsule Take 1 capsule (75 mg total) by mouth daily with breakfast. 04/29/20   Fredirick Glenys RAMAN, MD    Scheduled Meds: Continuous Infusions: PRN Meds:.  Allergies as of 10/05/2023   (No Known Allergies)    Family History  Problem Relation Age of Onset   Diabetes Mother    Diabetes Father    Heart disease Father     Social History   Socioeconomic History   Marital status: Legally Separated    Spouse name: Not on file   Number of children: Not on file   Years of education: Not on file   Highest education level: 5th grade  Occupational History   Not on file  Tobacco Use   Smoking status: Every Day    Types: Cigarettes   Smokeless tobacco: Never   Tobacco comments:    3-4 cigs per day. Wants to quit. Will make appt for smoking cessation discussion.  Vaping Use   Vaping status: Never Used  Substance and Sexual Activity   Alcohol use: Yes    Comment: rarely   Drug use: No   Sexual activity: Yes    Birth control/protection: Surgical  Other Topics Concern   Not on file  Social History Narrative   Highest level of education middle school  Works full time at [employer], owns a car   Spanish speaking only   Does not exercise regularly   Smokes 2-3 cigarettes per day and wants to quit   Drinks alcohol monthly or less, has 6 or more drinks at one time less than once a month   Feels secure in relationships.      (Above info from 07/21/2012 office visit)   Social Drivers of Health   Financial Resource Strain: Not on file  Food Insecurity: Not on file  Transportation Needs: No Transportation Needs (07/03/2020)   PRAPARE - Administrator, Civil Service (Medical): No    Lack of Transportation (Non-Medical): No  Physical Activity: Not on file  Stress: Not on file  Social Connections: Not on file  Intimate Partner  Violence: Not on file     Physical Exam: Vital signs: Vitals:   10/25/23 0814  BP: 125/74  Pulse: 64  Resp: 12  Temp: 97.7 F (36.5 C)  SpO2: 98%     General:   Alert,  Well-developed, well-nourished, pleasant and cooperative in NAD Lungs: No visible respiratory distress Heart:  Regular rate and rhythm; no murmurs, clicks, rubs,  or gallops. Abdomen: Soft, nontender, nondistended, bowel sound present, no peritoneal signs Rectal:  Deferred  GI:  Lab Results: No results for input(s): WBC, HGB, HCT, PLT in the last 72 hours. BMET No results for input(s): NA, K, CL, CO2, GLUCOSE, BUN, CREATININE, CALCIUM in the last 72 hours. LFT No results for input(s): PROT, ALBUMIN, AST, ALT, ALKPHOS, BILITOT, BILIDIR, IBILI in the last 72 hours. PT/INR No results for input(s): LABPROT, INR in the last 72 hours.   Studies/Results: No results found.  Impression/Plan: -Chronic GERD -History of gastric ulcer -Colon cancer screening  Recommendations --------------------------- -Proceed with EGD and colonoscopy today.  Risks (bleeding, infection, bowel perforation that could require surgery, sedation-related changes in cardiopulmonary systems), benefits (identification and possible treatment of source of symptoms, exclusion of certain causes of symptoms), and alternatives (watchful waiting, radiographic imaging studies, empiric medical treatment)  were explained to patient/family in detail and patient wishes to proceed.     LOS: 0 days   Layla Lah  MD, FACP 10/25/2023, 9:02 AM  Contact #  [phone]

## 2023-10-25 NOTE — Op Note (Signed)
 [facility] Patient Name: Rita Sanchez Procedure Date: 10/25/2023 MRN: [id number] Attending MD: Layla Lah , MD, [id number] Date of Birth: Dec 15, 1970 CSN: [id number] Age: 53 Admit Type: Outpatient Procedure:                Upper GI endoscopy Indications:              Heartburn Providers:                Layla Lah, MD, Particia Fischer, RN, Corene Southgate, Technician Referring MD:             Layla Lah, MD Medicines:                Sedation Administered by an Anesthesia Professional Complications:            No immediate complications. Estimated Blood Loss:     Estimated blood loss was minimal. Procedure:                Pre-Anesthesia Assessment:                           - Prior to the procedure, a History and Physical                            was performed, and patient medications and                            allergies were reviewed. The patient's tolerance of                            previous anesthesia was also reviewed. The risks                            and benefits of the procedure and the sedation                            options and risks were discussed with the patient.                            All questions were answered, and informed consent                            was obtained. Prior Anticoagulants: The patient has                            taken no anticoagulant or antiplatelet agents. ASA                            Grade Assessment: III - A patient with severe                            systemic disease. After reviewing the risks and  benefits, the patient was deemed in satisfactory                            condition to undergo the procedure.                           After obtaining informed consent, the endoscope was                            passed under direct vision. Throughout the                            procedure, the patient's blood pressure, pulse, and                             oxygen saturations were monitored continuously. The                            [id number]-[id number] ([id number]) Olympus endoscope was introduced                            through the mouth, and advanced to the second part                            of duodenum. The upper GI endoscopy was                            accomplished without difficulty. The patient                            tolerated the procedure well. Scope In: Scope Out: Findings:      The esophagus and gastroesophageal junction were examined with white       light. There were esophageal mucosal changes classified as Barrett's       stage C3-M7 per Prague criteria. These changes involved the mucosa       extending to the Z-line. Circumferential salmon-colored mucosa was       present from 30 to 33 cm and multiple tongues of salmon-colored mucosa       were present from 26 to 30 cm. The maximum longitudinal extent of these       esophageal mucosal changes was 7 cm in length. Mucosa was biopsied with       a cold forceps for histology in 4 quadrants at intervals of 2 cm at 27,       29, 31 and 33 cm from the incisors. A total of 2 specimen bottles were       sent to pathology. ( By mistake technician placed samples from 3 sites       into 1 bottle. 33, 31 and 29 all in 1 bottle)      A 5 cm hiatal hernia was present.      Scattered mild inflammation characterized by congestion (edema) and       erythema was found in the entire examined stomach. Biopsies were taken       with a cold forceps for histology.      The cardia and gastric fundus were  normal on retroflexion.      The duodenal bulb, first portion of the duodenum and second portion of       the duodenum were normal. Impression:               - Esophageal mucosal changes classified as                            Barrett's stage C3-M7 per Prague criteria. Biopsied.                           - 5 cm hiatal hernia.                           - Chronic gastritis.  Biopsied.                           - Normal duodenal bulb, first portion of the                            duodenum and second portion of the duodenum. Moderate Sedation:      Moderate (conscious) sedation was personally administered by an       anesthesia professional. The following parameters were monitored: oxygen       saturation, heart rate, blood pressure, and response to care. Recommendation:           - Perform a colonoscopy today.                           - Await pathology results.                           - Repeat upper endoscopy after studies are complete                            for surveillance of Barrett's esophagus.                           - Use Prilosec  (omeprazole ) 40 mg PO daily. Procedure Code(s):        --- Professional ---                           [phone], Esophagogastroduodenoscopy, flexible,                            transoral; with biopsy, single or multiple Diagnosis Code(s):        --- Professional ---                           K22.70, Barrett's esophagus without dysplasia                           K44.9, Diaphragmatic hernia without obstruction or                            gangrene  K29.50, Unspecified chronic gastritis without                            bleeding                           R12, Heartburn CPT copyright 2022 American Medical Association. All rights reserved. The codes documented in this report are preliminary and upon coder review may  be revised to meet current compliance requirements. Layla Lah, MD Layla Lah, MD 10/25/2023 10:03:38 AM Number of Addenda: 0

## 2023-10-26 NOTE — Anesthesia Postprocedure Evaluation (Signed)
 Anesthesia Post Note  Patient: Rita Sanchez  Procedure(s) Performed: COLONOSCOPY WITH PROPOFOL  ESOPHAGOGASTRODUODENOSCOPY (EGD) WITH PROPOFOL  BIOPSY POLYPECTOMY     Patient location during evaluation: Endoscopy Anesthesia Type: MAC Level of consciousness: awake Pain management: pain level controlled Vital Signs Assessment: post-procedure vital signs reviewed and stable Respiratory status: spontaneous breathing, nonlabored ventilation and respiratory function stable Cardiovascular status: blood pressure returned to baseline and stable Postop Assessment: no apparent nausea or vomiting Anesthetic complications: no   No notable events documented.  Last Vitals:  Vitals:   10/25/23 1010 10/25/23 1020  BP: 105/65   Pulse: 68 60  Resp: (!) 23 (!) 25  Temp:    SpO2: 96% 95%    Last Pain:  Vitals:   10/25/23 1020  TempSrc:   PainSc: 0-No pain   Pain Goal:                   Bernardino SQUIBB Veleda Mun

## 2023-10-27 ENCOUNTER — Encounter (HOSPITAL_COMMUNITY): Payer: Self-pay | Admitting: Gastroenterology

## 2023-10-28 LAB — SURGICAL PATHOLOGY

## 2023-11-02 ENCOUNTER — Telehealth: Payer: Self-pay | Admitting: General Practice

## 2023-11-02 ENCOUNTER — Encounter: Payer: Self-pay | Admitting: General Practice

## 2023-11-02 NOTE — Telephone Encounter (Signed)
 Rita Sanchez attempted to reach patient regarding a new pcp. She reached out to language services for assistance and was able to leave a message. I am also going to send out a letter in the event she did not get the voicemail. I have sent the letter on 11/02/23 to interpreting services and once received translated, will mail to patient.
